# Patient Record
Sex: Male | Born: 1985 | Race: White | Hispanic: No | Marital: Single | Smoking: Former smoker
Health system: Southern US, Community
[De-identification: ages and names within clinical notes are randomized; demographics above are authoritative.]

## PROBLEM LIST (undated history)

## (undated) DIAGNOSIS — I10 Essential (primary) hypertension: Secondary | ICD-10-CM

## (undated) DIAGNOSIS — R569 Unspecified convulsions: Secondary | ICD-10-CM

## (undated) DIAGNOSIS — F32A Depression, unspecified: Secondary | ICD-10-CM

## (undated) HISTORY — PX: FINGER SURGERY: SHX640

## (undated) HISTORY — DX: Unspecified convulsions: R56.9

## (undated) HISTORY — DX: Essential (primary) hypertension: I10

## (undated) HISTORY — PX: SHOULDER SURGERY: SHX246

## (undated) HISTORY — PX: OTHER SURGICAL HISTORY: SHX169

## (undated) HISTORY — DX: Depression, unspecified: F32.A

## (undated) HISTORY — PX: IM NAILING HUMERUS: SUR733

---
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
## 2003-05-05 ENCOUNTER — Emergency Department (HOSPITAL_COMMUNITY): Admission: EM | Admit: 2003-05-05 | Discharge: 2003-05-05 | Payer: Self-pay | Admitting: Emergency Medicine

## 2003-09-03 ENCOUNTER — Emergency Department (HOSPITAL_COMMUNITY): Admission: EM | Admit: 2003-09-03 | Discharge: 2003-09-03 | Payer: Self-pay | Admitting: *Deleted

## 2003-12-24 ENCOUNTER — Emergency Department (HOSPITAL_COMMUNITY): Admission: AD | Admit: 2003-12-24 | Discharge: 2003-12-24 | Payer: Self-pay | Admitting: Family Medicine

## 2004-10-28 ENCOUNTER — Ambulatory Visit: Payer: Self-pay | Admitting: Psychiatry

## 2004-10-28 ENCOUNTER — Inpatient Hospital Stay (HOSPITAL_COMMUNITY): Admission: EM | Admit: 2004-10-28 | Discharge: 2004-10-31 | Payer: Self-pay | Admitting: Psychiatry

## 2005-04-21 ENCOUNTER — Emergency Department (HOSPITAL_COMMUNITY): Admission: EM | Admit: 2005-04-21 | Discharge: 2005-04-21 | Payer: Self-pay | Admitting: Emergency Medicine

## 2005-09-13 ENCOUNTER — Emergency Department (HOSPITAL_COMMUNITY): Admission: EM | Admit: 2005-09-13 | Discharge: 2005-09-13 | Payer: Self-pay | Admitting: Emergency Medicine

## 2005-11-25 ENCOUNTER — Emergency Department (HOSPITAL_COMMUNITY): Admission: EM | Admit: 2005-11-25 | Discharge: 2005-11-25 | Payer: Self-pay | Admitting: Emergency Medicine

## 2005-11-25 ENCOUNTER — Ambulatory Visit: Payer: Self-pay | Admitting: Orthopedic Surgery

## 2005-11-29 ENCOUNTER — Ambulatory Visit (HOSPITAL_BASED_OUTPATIENT_CLINIC_OR_DEPARTMENT_OTHER): Admission: RE | Admit: 2005-11-29 | Discharge: 2005-11-29 | Payer: Self-pay | Admitting: Orthopedic Surgery

## 2005-12-14 ENCOUNTER — Encounter (HOSPITAL_COMMUNITY): Admission: RE | Admit: 2005-12-14 | Discharge: 2006-01-13 | Payer: Self-pay | Admitting: Orthopedic Surgery
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
## 2006-12-02 ENCOUNTER — Emergency Department (HOSPITAL_COMMUNITY): Admission: EM | Admit: 2006-12-02 | Discharge: 2006-12-02 | Payer: Self-pay | Admitting: Emergency Medicine

## 2007-03-02 ENCOUNTER — Emergency Department (HOSPITAL_COMMUNITY): Admission: EM | Admit: 2007-03-02 | Discharge: 2007-03-02 | Payer: Self-pay | Admitting: Emergency Medicine

## 2008-03-18 ENCOUNTER — Emergency Department (HOSPITAL_COMMUNITY): Admission: EM | Admit: 2008-03-18 | Discharge: 2008-03-18 | Payer: Self-pay | Admitting: Emergency Medicine

## 2008-03-19 ENCOUNTER — Emergency Department (HOSPITAL_COMMUNITY): Admission: EM | Admit: 2008-03-19 | Discharge: 2008-03-20 | Payer: Self-pay | Admitting: Emergency Medicine

## 2008-04-17 ENCOUNTER — Emergency Department (HOSPITAL_COMMUNITY): Admission: EM | Admit: 2008-04-17 | Discharge: 2008-04-17 | Payer: Self-pay | Admitting: Emergency Medicine
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
## 2008-12-14 ENCOUNTER — Emergency Department (HOSPITAL_COMMUNITY): Admission: EM | Admit: 2008-12-14 | Discharge: 2008-12-14 | Payer: Self-pay | Admitting: Emergency Medicine

## 2009-04-09 ENCOUNTER — Encounter: Admission: RE | Admit: 2009-04-09 | Discharge: 2009-04-09 | Payer: Self-pay | Admitting: Cardiovascular Disease

## 2009-08-15 ENCOUNTER — Emergency Department (HOSPITAL_COMMUNITY): Admission: EM | Admit: 2009-08-15 | Discharge: 2009-08-15 | Payer: Self-pay | Admitting: Emergency Medicine

## 2009-08-19 ENCOUNTER — Ambulatory Visit (HOSPITAL_BASED_OUTPATIENT_CLINIC_OR_DEPARTMENT_OTHER): Admission: RE | Admit: 2009-08-19 | Discharge: 2009-08-19 | Payer: Self-pay | Admitting: Plastic Surgery

## 2009-09-16 ENCOUNTER — Encounter (HOSPITAL_COMMUNITY): Admission: RE | Admit: 2009-09-16 | Discharge: 2009-10-16 | Payer: Self-pay | Admitting: Plastic Surgery

## 2011-01-27 LAB — POCT HEMOGLOBIN-HEMACUE: Hemoglobin: 18.2 g/dL — ABNORMAL HIGH (ref 13.0–17.0)

## 2011-03-11 NOTE — Discharge Summary (Signed)
Edward Zamora, Edward Zamora                 ACCOUNT NO.:  [id number]   MEDICAL RECORD NO.:  [id number]          PATIENT TYPE:  IPS   LOCATION:  0305                          FACILITY:  [facility]   PHYSICIAN:  Jeanice Lim, M.D. DATE OF BIRTH:  1986/05/30   DATE OF ADMISSION:  10/28/2004  DATE OF DISCHARGE:  10/31/2004                                 DISCHARGE SUMMARY   IDENTIFYING INFORMATION:  This is a 25 year old white male who is single.  This is a voluntary admission.   HISTORY OF PRESENT ILLNESS:  This patient had taken approximately 16 tabs of  Strattera in an intentional suicide attempt after arguing with his  girlfriend.  His parents had referred him for admission, feeling that his  temper had gotten worse in the past two months, since he had been under  considerably increased stress.  His girlfriend had gotten pregnant and they  had a 51-month-old baby.  The patient was feeling under pressure to get  engaged to her and get married and they had been arguing about various  circumstances surrounding a potential marriage.  On initial presentation,  the patient endorsed feeling anxious, agitated, disappointed in himself,  endorses feeling worried about being a father and living up to his  responsibilities.  Had a history of being diagnosed with ADHD and history of  impulsive behaviors.  The patient, himself, felt a bit impulsive and not  well-controlled.  He was cooperative and pleasant at the time of admission.   PAST PSYCHIATRIC HISTORY:  This is the patient's first admission to [facility].  He did have a prior history of overdosing on  Xanax several years ago.  Had a history of being diagnosed with ADHD and had  some breaking and entering charges back in 2004 but none recently.   SOCIAL HISTORY:  The patient had finished high school, working odd jobs,  trying to scrape enough [employer] together to buy his girlfriend a ring.  He is  of the [facility] faith and  currently living with his mother and father.  Has a  34-month-old baby, who is currently living with the patient's finance.   FAMILY HISTORY:  Unremarkable for mental illness or depression.   ALCOHOL/DRUG HISTORY:  The patient denied any substance abuse.   PRIMARY CARE PHYSICIAN:  Dr. Littie Deeds.   MEDICAL HISTORY:  He denied having any active medical problems or taking any  medications on a routine basis.  He had previously been prescribed  Strattera, which he was not taking because he did not like the way it made  him feel.   ALLERGIES:  None.   POSITIVE PHYSICAL FINDINGS:  The patient's full physical examination had  been done in the emergency room by Dr. Margretta Ditty and was negative for any  significant findings.  At the time of admission, the patient was alert, well-  oriented, healthy-appearing, white male who was in no acute distress.  Vital  signs were within normal limits.   LABORATORY DATA:  Urine negative.  Metabolic panel was within normal limits.  His electrolytes were normal.  BUN 11, creatinine 1.1.  Liver enzymes were  all within normal limits.  TSH normal at 2.404.  He was also medical cleared  in the emergency room with a normal EKG.   INITIAL MENTAL STATUS EXAM:  The patient is fully alert, pleasant,  cooperative with an appropriate affect.  Calm and pleasant.  Affect is  constricted.  Speech is normal in pace and tone.  It is relevant, pertinent.  He tracks conversation well.  Mood is depressed.  He expresses that he feels  quite disappointed in himself but feels overwhelmed when his fiance starts  to yell at him.  He was disappointed because he picked out a ring for her  and then it turns out that she does not like it and she wants one that he  feels that he is unable to afford.  Thought process is positive for suicidal  ideation, just feeling hopeless that he will not be able to live up to the  responsibilities and the demands that everyone is putting on him.   No  homicidal ideation.  He is somewhat intellectually limited.  Cognitively  intact and oriented x 3.  Insight is limited.  Impulse control and judgment  within normal limits.  Intellect limited.   ADMISSION DIAGNOSES:   AXIS I:  1.  Mood disorder not otherwise specified.  2.  Rule out bipolar disorder, type 2.  3.  Attention-deficit hyperactivity disorder, by history.   AXIS II:  No diagnosis.   AXIS III:  Status post SNRI overdose.   AXIS IV:  Moderate (stress with family relationships; having a stable  relationship with parents is an asset to him).   AXIS V:  Current 25; past year 26.   HOSPITAL COURSE:  The patient was admitted and started on Lexapro 5 mg  daily.  He responded well to the medication, did well in groups, expressed  his concerns and was able to verbalize his concerns about the stress that he  was under.  He was tolerating the medication well and was able to verbalize  that he would continue taking his medication and would follow through with  his therapist after he left.  A family session was held on the 8th and the  patient's parents were able to describe some of his impulsive behaviors that  were bothering them and were able to verbalize their concerns.  Specifically, they were concerned that he was not telling the truth and was  stealing things.  The patient was able to verbalize and commit to handling  his anger better, not having outbursts, was willing to let his mother  monitor him taking his medication.  The patient felt better on the Lexapro  and was tolerating it well.  By the 8th, he was endorsing no suicidal  thoughts, feeling in control, feeling that he could cope with the pressures  and his parents were willing to take him back and he agreed to allow them to  monitor his medications.  The decision was made to discharge him.   MENTAL STATUS EXAM:  At discharge, the patient was fully alert, cooperative, pleasant.  Insight adequate.  He had been  interacting well in group.  No  suicidal or homicidal thoughts.  His mood was euthymic.  No signs of  psychosis.   FINAL DIAGNOSES:   AXIS I:  1.  Adjustment disorder with underlying depression.  2.  Attention-deficit hyperactivity disorder, by history.   AXIS II:  No diagnosis.   AXIS III:  Status post SNRI overdose.   AXIS IV:  Moderate (stress related to stage of life).   AXIS V:  Current 55; past year 60-65.   DISCHARGE MEDICATIONS:  Lexapro 5 mg p.o. q.a.m.   FOLLOW UP:  The patient was discharged with plans to follow up [place] with mental health and the casemanagers were scheduled to call him  with an appointment.      MAS/MEDQ  D:  11/29/2004  T:  11/29/2004  Job:  [id number]
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
## 2011-03-11 NOTE — Procedures (Signed)
   NAMECONSTANCE, Edward Zamora                             ACCOUNT NO.:  [id number]   MEDICAL RECORD NO.:  [id number]                   PATIENT TYPE:  EMS   LOCATION:  ED                                   FACILITY:  [facility]   PHYSICIAN:  Edward L. Juanetta Gosling, M.D.             DATE OF BIRTH:  1986/02/17   DATE OF PROCEDURE:  08/08/2002  DATE OF DISCHARGE:  08/08/2002                                EKG INTERPRETATION   TIME AND DATE:  6045 on 08/08/02   INTERPRETATION:  The rhythm is sinus rhythm with a rate in the 70s.  Small Q  waves are seen inferiorly.  These may be of no significance but clinical  correlation is suggested.  There is a suggestion of early repolarization.   IMPRESSION:  Borderline electrocardiogram.                                               [name], M.D.    ELH/MEDQ  D:  08/08/2002  T:  08/09/2002  Job:  [id number]
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
## 2013-04-11 ENCOUNTER — Emergency Department (INDEPENDENT_AMBULATORY_CARE_PROVIDER_SITE_OTHER): Payer: Medicare Other

## 2013-04-11 ENCOUNTER — Emergency Department (INDEPENDENT_AMBULATORY_CARE_PROVIDER_SITE_OTHER)
Admission: EM | Admit: 2013-04-11 | Discharge: 2013-04-11 | Disposition: A | Discharge status: Home / Self Care | Payer: Medicare Other | Source: Home / Self Care | Attending: Family Medicine | Admitting: Family Medicine

## 2013-04-11 ENCOUNTER — Encounter (HOSPITAL_COMMUNITY): Payer: Self-pay | Admitting: Emergency Medicine

## 2013-04-11 DIAGNOSIS — S43429A Sprain of unspecified rotator cuff capsule, initial encounter: Secondary | ICD-10-CM

## 2013-04-11 DIAGNOSIS — S46012A Strain of muscle(s) and tendon(s) of the rotator cuff of left shoulder, initial encounter: Secondary | ICD-10-CM

## 2013-04-11 DIAGNOSIS — S335XXA Sprain of ligaments of lumbar spine, initial encounter: Secondary | ICD-10-CM

## 2013-04-11 DIAGNOSIS — S39012A Strain of muscle, fascia and tendon of lower back, initial encounter: Secondary | ICD-10-CM

## 2013-04-11 MED ORDER — IBUPROFEN 600 MG PO TABS: 600.0000 mg | ORAL_TABLET | Freq: Three times a day (TID) | ORAL | Status: DC | PRN

## 2013-04-11 MED ORDER — TRAMADOL HCL 50 MG PO TABS: 50.0000 mg | ORAL_TABLET | Freq: Four times a day (QID) | ORAL | Status: DC | PRN

## 2013-04-11 MED ORDER — CYCLOBENZAPRINE HCL 10 MG PO TABS: 10.0000 mg | ORAL_TABLET | Freq: Two times a day (BID) | ORAL | Status: DC | PRN
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
## 2015-02-27 ENCOUNTER — Emergency Department (HOSPITAL_COMMUNITY)
Admission: EM | Admit: 2015-02-27 | Discharge: 2015-02-27 | Disposition: A | Discharge status: Home / Self Care | Payer: Medicare Other | Attending: Emergency Medicine | Admitting: Emergency Medicine

## 2015-02-27 ENCOUNTER — Emergency Department (HOSPITAL_COMMUNITY): Payer: Medicare Other

## 2015-02-27 ENCOUNTER — Encounter (HOSPITAL_COMMUNITY): Payer: Self-pay | Admitting: *Deleted

## 2015-02-27 DIAGNOSIS — Y998 Other external cause status: Secondary | ICD-10-CM | POA: Insufficient documentation

## 2015-02-27 DIAGNOSIS — W108XXA Fall (on) (from) other stairs and steps, initial encounter: Secondary | ICD-10-CM | POA: Insufficient documentation

## 2015-02-27 DIAGNOSIS — Y9289 Other specified places as the place of occurrence of the external cause: Secondary | ICD-10-CM | POA: Insufficient documentation

## 2015-02-27 DIAGNOSIS — S300XXA Contusion of lower back and pelvis, initial encounter: Secondary | ICD-10-CM

## 2015-02-27 DIAGNOSIS — S199XXA Unspecified injury of neck, initial encounter: Secondary | ICD-10-CM | POA: Diagnosis present

## 2015-02-27 DIAGNOSIS — Y9389 Activity, other specified: Secondary | ICD-10-CM | POA: Diagnosis not present

## 2015-02-27 DIAGNOSIS — S134XXA Sprain of ligaments of cervical spine, initial encounter: Secondary | ICD-10-CM | POA: Diagnosis not present

## 2015-02-27 MED ORDER — CYCLOBENZAPRINE HCL 10 MG PO TABS: 10.0000 mg | ORAL_TABLET | Freq: Two times a day (BID) | ORAL | Status: DC | PRN

## 2015-02-27 MED ORDER — HYDROCODONE-ACETAMINOPHEN 5-325 MG PO TABS: 1.0000 | ORAL_TABLET | ORAL | Status: DC | PRN

## 2015-02-27 MED ORDER — NAPROXEN 500 MG PO TABS: 500.0000 mg | ORAL_TABLET | Freq: Two times a day (BID) | ORAL | Status: DC

## 2015-02-27 MED ORDER — OXYCODONE-ACETAMINOPHEN 5-325 MG PO TABS
1.0000 | ORAL_TABLET | Freq: Once | ORAL | Status: AC
  Administered 2015-02-27: 1 via ORAL
  Filled 2015-02-27: qty 1

## 2015-02-27 NOTE — ED Provider Notes (Signed)
CSN: [id number]     Arrival date & time 02/27/15  1413 History   First MD Initiated Contact with Patient 02/27/15 1451     Chief Complaint  Patient presents with  . Fall     (Consider location/radiation/quality/duration/timing/severity/associated sxs/prior Treatment) Patient is a 29 y.o. male presenting with fall.  Fall This is a new problem. The current episode started yesterday. The problem has been gradually worsening. Associated symptoms include neck pain. The symptoms are aggravated by bending and walking. He has tried nothing for the symptoms.   Lesly DukesCasey W Cassidy is a 29 y.o. male who presents to the ED with neck and back pain s/p fall yesterday. He states he was going up steps onto a porch that was not stable and he fell approximately 8 feet. He landed on his back. Today he has increased pain to the back of his neck and his lower back. He denies n/v, los of control of bladder or bowels, head injury or LOC.   History reviewed. No pertinent past medical history. Past Surgical History  Procedure Laterality Date  . Shoulder surgery    . Im nailing humerus    . Finger    . Finger surgery     History reviewed. No pertinent family history. History  Substance Use Topics  . Smoking status: Never Smoker   . Smokeless tobacco: Current User    Types: Chew  . Alcohol Use: No    Review of Systems  Musculoskeletal: Positive for back pain and neck pain.  all other systems negative    Allergies  Review of patient's allergies indicates no known allergies.  Home Medications   Prior to Admission medications   Medication Sig Start Date End Date Taking? Authorizing Provider  cyclobenzaprine (FLEXERIL) 10 MG tablet Take 1 tablet (10 mg total) by mouth 2 (two) times daily as needed for muscle spasms. 02/27/15   Shaul Trautman Orlene OchM Davanee Klinkner, NP  HYDROcodone-acetaminophen (NORCO/VICODIN) 5-325 MG per tablet Take 1 tablet by mouth every 4 (four) hours as needed. 02/27/15   Chip Canepa Orlene OchM Lalita Ebel, NP  naproxen (NAPROSYN)  500 MG tablet Take 1 tablet (500 mg total) by mouth 2 (two) times daily. 02/27/15   Charlyn Vialpando Orlene OchM [place] Ke, NP   BP 154/95 mmHg  Pulse 59  Temp(Src) 98.2 F (36.8 C) (Oral)  Resp 14  Ht 5\' 7"  (1.702 m)  Wt 200 lb (90.719 kg)  BMI 31.32 kg/m2  SpO2 96% Physical Exam  Constitutional: He is oriented to person, place, and time. He appears well-developed and well-nourished. No distress.  HENT:  Head: Normocephalic and atraumatic.  Eyes: Conjunctivae and EOM are normal. Pupils are equal, round, and reactive to light.  Neck: Normal range of motion. Neck supple. Spinous process tenderness and muscular tenderness present.  Cardiovascular: Normal rate and regular rhythm.   Pulmonary/Chest: Effort normal. No respiratory distress. He has no wheezes. He has no rales.  Abdominal: Soft. Bowel sounds are normal. There is no tenderness.  Musculoskeletal: Normal range of motion. He exhibits no edema.       Cervical back: He exhibits tenderness.       Lumbar back: He exhibits tenderness and pain. He exhibits no deformity, no laceration, no spasm and normal pulse. Decreased range of motion: due to pain.       Back:  Radial pulses 2+ bilateral, grips equal, adequate circulation.   Neurological: He is alert and oriented to person, place, and time. He has normal strength. No cranial nerve deficit or sensory deficit. Coordination and  gait normal.  Reflex Scores:      Bicep reflexes are 2+ on the right side and 2+ on the left side.      Brachioradialis reflexes are 2+ on the right side and 2+ on the left side.      Patellar reflexes are 2+ on the right side and 2+ on the left side.      Achilles reflexes are 2+ on the right side and 2+ on the left side. Skin: Skin is warm and dry.  Psychiatric: He has a normal mood and affect. His behavior is normal.  Nursing note and vitals reviewed.   ED Course  Procedures (including critical care time) Labs Review Labs Reviewed - No data to display  Imaging Review Dg  Cervical Spine Complete  02/27/2015   CLINICAL DATA:  Larey SeatFell last pm when going up steps , app 8 steps, Says the steps broke under him. Pain Lt lower leg and low back. patient states "hurts a little bit".  EXAM: CERVICAL SPINE  4+ VIEWS  COMPARISON:  Cervical CT, 11/25/2005  FINDINGS: No fracture.  No spondylolisthesis.  Endplate spurring and mild sclerosis noted at C5-C6 with minor loss of disc height. This has developed since the prior CT. Remaining disc spaces are well preserved.  Uncovertebral spurring leads to mild neural foraminal narrowing bilaterally at C5-C6. Remaining neural foramina are well preserved.  Normal soft tissues.  IMPRESSION: No fracture or acute finding.   Electronically Signed   By: Amie Portlandavid  Ormond M.D.   On: 02/27/2015 16:03   Dg Lumbar Spine Complete  02/27/2015   CLINICAL DATA:  29 year old male with a history of lumbar back pain  EXAM: LUMBAR SPINE - COMPLETE 4+ VIEW  COMPARISON:  None.  FINDINGS: Lumbar Spine:  Lumbar vertebral elements maintain normal alignment without evidence of anterolisthesis, retrolisthesis, subluxation.  No fracture line identified. Vertebral body heights maintained as well as disc space heights.  No significant degenerative disc disease or endplate changes. No significant facet changes.  Unremarkable appearance of the visualized abdomen.  Defect at the anterior superior endplate of L4 is chronic.  Oblique images demonstrate no evidence of displaced pars defect.  IMPRESSION: No radiographic evidence of acute fracture or malalignment of the lumbar spine.  Signed,  Yvone NeuJaime S. Loreta AveWagner, DO  [facility]  [facility]   Electronically Signed   By: Gilmer MorJaime  Wagner D.O.   On: 02/27/2015 16:03    MDM  29 y.o. male with neck and back pain s/p fall yesterday. Stable for d/c without fractures on x-ray and without neuro deficits. Discussed with the patient clinical and x-ray findings and plan of care and all questioned fully answered.  He will return if any problems arise.  Final diagnoses:  Sprain of ligaments of cervical spine, initial encounter  Contusion of lower back, initial encounter        [facility] Fontaine Hehl, NP 02/27/15 1636  Gilda Creasehristopher J Pollina, MD 02/28/15 [phone]

## 2015-02-27 NOTE — ED Notes (Signed)
Pt made aware to return if symptoms worsen or if any life threatening symptoms occur.   

## 2015-02-27 NOTE — Discharge Instructions (Signed)
Your x-rays today show no fractures. We are treating your pain. Do not take the muscle relaxant or narcotic if driving because they will make you sleepy.   Cervical Sprain A cervical sprain is an injury in the neck in which the strong, fibrous tissues (ligaments) that connect your neck bones stretch or tear. Cervical sprains can range from mild to severe. Severe cervical sprains can cause the neck vertebrae to be unstable. This can lead to damage of the spinal cord and can result in serious nervous system problems. The amount of time it takes for a cervical sprain to get better depends on the cause and extent of the injury. Most cervical sprains heal in 1 to 3 weeks. CAUSES  Severe cervical sprains may be caused by:   Contact sport injuries (such as from football, rugby, wrestling, hockey, auto racing, gymnastics, diving, martial arts, or boxing).   Motor vehicle collisions.   Whiplash injuries. This is an injury from a sudden forward and backward whipping movement of the head and neck.  Falls.  Mild cervical sprains may be caused by:   Being in an awkward position, such as while cradling a telephone between your ear and shoulder.   Sitting in a chair that does not offer proper support.   Working at a poorly Marketing executivedesigned computer station.   Looking up or down for long periods of time.  SYMPTOMS   Pain, soreness, stiffness, or a burning sensation in the front, back, or sides of the neck. This discomfort may develop immediately after the injury or slowly, 24 hours or more after the injury.   Pain or tenderness directly in the middle of the back of the neck.   Shoulder or upper back pain.   Limited ability to move the neck.   Headache.   Dizziness.   Weakness, numbness, or tingling in the hands or arms.   Muscle spasms.   Difficulty swallowing or chewing.   Tenderness and swelling of the neck.  DIAGNOSIS  Most of the time your health care provider can diagnose  a cervical sprain by taking your history and doing a physical exam. Your health care provider will ask about previous neck injuries and any known neck problems, such as arthritis in the neck. X-rays may be taken to find out if there are any other problems, such as with the bones of the neck. Other tests, such as a CT scan or MRI, may also be needed.  TREATMENT  Treatment depends on the severity of the cervical sprain. Mild sprains can be treated with rest, keeping the neck in place (immobilization), and pain medicines. Severe cervical sprains are immediately immobilized. Further treatment is done to help with pain, muscle spasms, and other symptoms and may include:  Medicines, such as pain relievers, numbing medicines, or muscle relaxants.   Physical therapy. This may involve stretching exercises, strengthening exercises, and posture training. Exercises and improved posture can help stabilize the neck, strengthen muscles, and help stop symptoms from returning.  HOME CARE INSTRUCTIONS   Put ice on the injured area.   Put ice in a plastic bag.   Place a towel between your skin and the bag.   Leave the ice on for 15-20 minutes, 3-4 times a day.   If your injury was severe, you may have been given a cervical collar to wear. A cervical collar is a two-piece collar designed to keep your neck from moving while it heals.  Do not remove the collar unless instructed by your  health care provider.  If you have long hair, keep it outside of the collar.  Ask your health care provider before making any adjustments to your collar. Minor adjustments may be required over time to improve comfort and reduce pressure on your chin or on the back of your head.  Ifyou are allowed to remove the collar for cleaning or bathing, follow your health care provider's instructions on how to do so safely.  Keep your collar clean by wiping it with mild soap and water and drying it completely. If the collar you have  been given includes removable pads, remove them every 1-2 days and hand wash them with soap and water. Allow them to air dry. They should be completely dry before you wear them in the collar.  If you are allowed to remove the collar for cleaning and bathing, wash and dry the skin of your neck. Check your skin for irritation or sores. If you see any, tell your health care provider.  Do not drive while wearing the collar.   Only take over-the-counter or prescription medicines for pain, discomfort, or fever as directed by your health care provider.   Keep all follow-up appointments as directed by your health care provider.   Keep all physical therapy appointments as directed by your health care provider.   Make any needed adjustments to your workstation to promote good posture.   Avoid positions and activities that make your symptoms worse.   Warm up and stretch before being active to help prevent problems.  SEEK MEDICAL CARE IF:   Your pain is not controlled with medicine.   You are unable to decrease your pain medicine over time as planned.   Your activity level is not improving as expected.  SEEK IMMEDIATE MEDICAL CARE IF:   You develop any bleeding.  You develop stomach upset.  You have signs of an allergic reaction to your medicine.   Your symptoms get worse.   You develop new, unexplained symptoms.   You have numbness, tingling, weakness, or paralysis in any part of your body.  MAKE SURE YOU:   Understand these instructions.  Will watch your condition.  Will get help right away if you are not doing well or get worse. Document Released: 08/07/2007 Document Revised: 10/15/2013 Document Reviewed: 04/17/2013 [facility] Patient Information 2015 [place]. This information is not intended to replace advice given to you by your health care provider. Make sure you discuss any questions you have with your health care provider.

## 2015-02-27 NOTE — ED Notes (Signed)
Fell last pm when going up steps , app 8 steps,  Says the steps broke under him.  Pain  Lt lower leg and low back.  No HI, neck "hurts a little bit".

## 2015-06-29 ENCOUNTER — Emergency Department (HOSPITAL_COMMUNITY): Payer: Medicare Other

## 2015-06-29 ENCOUNTER — Emergency Department (HOSPITAL_COMMUNITY)
Admission: EM | Admit: 2015-06-29 | Discharge: 2015-06-29 | Disposition: A | Discharge status: Home / Self Care | Payer: Medicare Other | Attending: Emergency Medicine | Admitting: Emergency Medicine

## 2015-06-29 ENCOUNTER — Encounter (HOSPITAL_COMMUNITY): Payer: Self-pay | Admitting: Emergency Medicine

## 2015-06-29 DIAGNOSIS — Y9289 Other specified places as the place of occurrence of the external cause: Secondary | ICD-10-CM | POA: Diagnosis not present

## 2015-06-29 DIAGNOSIS — W2209XA Striking against other stationary object, initial encounter: Secondary | ICD-10-CM | POA: Diagnosis not present

## 2015-06-29 DIAGNOSIS — S60221A Contusion of right hand, initial encounter: Secondary | ICD-10-CM | POA: Insufficient documentation

## 2015-06-29 DIAGNOSIS — Y998 Other external cause status: Secondary | ICD-10-CM | POA: Diagnosis not present

## 2015-06-29 DIAGNOSIS — Y9389 Activity, other specified: Secondary | ICD-10-CM | POA: Insufficient documentation

## 2015-06-29 DIAGNOSIS — Z791 Long term (current) use of non-steroidal anti-inflammatories (NSAID): Secondary | ICD-10-CM | POA: Insufficient documentation

## 2015-06-29 DIAGNOSIS — S6991XA Unspecified injury of right wrist, hand and finger(s), initial encounter: Secondary | ICD-10-CM | POA: Diagnosis present

## 2015-06-29 MED ORDER — IBUPROFEN 600 MG PO TABS: 600.0000 mg | ORAL_TABLET | Freq: Four times a day (QID) | ORAL | Status: DC | PRN

## 2015-06-29 NOTE — ED Notes (Signed)
Injury to right hand after hitting a wall with is hand.  Rates pain 8/10.

## 2015-06-29 NOTE — Discharge Instructions (Signed)
Contusion °A contusion is a deep bruise. Contusions are the result of an injury that caused bleeding under the skin. The contusion may turn blue, purple, or yellow. Minor injuries will give you a painless contusion, but more severe contusions may stay painful and swollen for a few weeks.  °CAUSES  °A contusion is usually caused by a blow, trauma, or direct force to an area of the body. °SYMPTOMS  °· Swelling and redness of the injured area. °· Bruising of the injured area. °· Tenderness and soreness of the injured area. °· Pain. °DIAGNOSIS  °The diagnosis can be made by taking a history and physical exam. An X-ray, CT scan, or MRI may be needed to determine if there were any associated injuries, such as fractures. °TREATMENT  °Specific treatment will depend on what area of the body was injured. In general, the best treatment for a contusion is resting, icing, elevating, and applying cold compresses to the injured area. Over-the-counter medicines may also be recommended for pain control. Ask your caregiver what the best treatment is for your contusion. °HOME CARE INSTRUCTIONS  °· Put ice on the injured area. °¨ Put ice in a plastic bag. °¨ Place a towel between your skin and the bag. °¨ Leave the ice on for 15-20 minutes, 3-4 times a day, or as directed by your health care provider. °· Only take over-the-counter or prescription medicines for pain, discomfort, or fever as directed by your caregiver. Your caregiver may recommend avoiding anti-inflammatory medicines (aspirin, ibuprofen, and naproxen) for 48 hours because these medicines may increase bruising. °· Rest the injured area. °· If possible, elevate the injured area to reduce swelling. °SEEK IMMEDIATE MEDICAL CARE IF:  °· You have increased bruising or swelling. °· You have pain that is getting worse. °· Your swelling or pain is not relieved with medicines. °MAKE SURE YOU:  °· Understand these instructions. °· Will watch your condition. °· Will get help right  away if you are not doing well or get worse. °Document Released: 07/20/2005 Document Revised: 10/15/2013 Document Reviewed: 08/15/2011 °ExitCare® Patient Information ©2015 [employer], LLC. This information is not intended to replace advice given to you by your health care provider. Make sure you discuss any questions you have with your health care provider. ° °

## 2015-06-29 NOTE — ED Provider Notes (Signed)
CSN: [id number]     Arrival date & time 06/29/15  1325 History  This chart was scribed for non-physician practitioner, Burgess Amor, PA-C working with Samuel Jester, DO by Gwenyth Ober, ED scribe. This patient was seen in room APFT22/APFT22 and the patient's care was started at 4:23 PM   Chief Complaint  Patient presents with  . Hand Injury   The history is provided by the patient. No language interpreter was used.    HPI Comments: Edward Zamora is a 29 y.o. male who presents to the Emergency Department complaining of constant, moderate right hand pain that started 4.5 hours ago. He states numbness of his right 3rd, 4th and 5th fingers as associated symptoms. Pt applied ice and tried Aspirin with no relief. Pt reports onset of pain started after he punched a wall. He is right-hand dominant. Pt works in [employer]. He denies weakness in his wrist or fingers.  History reviewed. No pertinent past medical history. Past Surgical History  Procedure Laterality Date  . Shoulder surgery    . Im nailing humerus    . Finger    . Finger surgery     History reviewed. No pertinent family history. Social History  Substance Use Topics  . Smoking status: Never Smoker   . Smokeless tobacco: Current User    Types: Chew  . Alcohol Use: No    Review of Systems  Constitutional: Negative for fever.  Musculoskeletal: Positive for arthralgias. Negative for myalgias and joint swelling.  Neurological: Positive for numbness. Negative for weakness.  All other systems reviewed and are negative.  Allergies  Review of patient's allergies indicates no known allergies.  Home Medications   Prior to Admission medications   Medication Sig Start Date End Date Taking? Authorizing Provider  cyclobenzaprine (FLEXERIL) 10 MG tablet Take 1 tablet (10 mg total) by mouth 2 (two) times daily as needed for muscle spasms. 02/27/15   Hope Orlene Och, NP  HYDROcodone-acetaminophen (NORCO/VICODIN) 5-325 MG per tablet Take 1  tablet by mouth every 4 (four) hours as needed. 02/27/15   Hope Orlene Och, NP  ibuprofen (ADVIL,MOTRIN) 600 MG tablet Take 1 tablet (600 mg total) by mouth every 6 (six) hours as needed. 06/29/15   Burgess Amor, PA-C  naproxen (NAPROSYN) 500 MG tablet Take 1 tablet (500 mg total) by mouth 2 (two) times daily. 02/27/15   Hope Orlene Och, NP   BP 121/84 mmHg  Pulse 63  Temp(Src) 98.3 F (36.8 C) (Oral)  Resp 16  Ht  (1.651 m)  Wt 156 lb 4.8 oz (70.897 kg)  BMI 26.01 kg/m2  SpO2 100% Physical Exam  Constitutional: He appears well-developed and well-nourished.  HENT:  Head: Atraumatic.  Neck: Normal range of motion.  Cardiovascular:  Pulses equal bilaterally  Musculoskeletal: He exhibits tenderness. He exhibits no edema.  Full strength of his finger.  Decreased sensation to fine touch in his 5th, ring finger and 3rd finger.  Tender at his 4th and 5th proximal metacarpals.  Full range of wrist extension.flexion.  Normal resisted flex/ext of fingers, but with increased pain. Less than 2 s cap refill in his finger tips. Skin is intact. No edema. No deformity.  Neurological: He is alert. He has normal strength. He displays normal reflexes. No sensory deficit.  Skin: Skin is warm and dry.  Psychiatric: He has a normal mood and affect.  Nursing note and vitals reviewed.   ED Course  Procedures   DIAGNOSTIC STUDIES: Oxygen Saturation is 99% on RA,  normal by my interpretation.    COORDINATION OF CARE: 4:26 PM Discussed treatment plan with pt which includes a supportive splint, anti-inflammatories and rest. Will refer to an orthopedist for follow-up if no improvement. Pt agreed to plan.   Labs Review Labs Reviewed - No data to display  Imaging Review Dg Hand Complete Right  06/29/2015   CLINICAL DATA:  Hit a brick wall with right fist after argument. Pain in the back of the hand at the metacarpophalangeal joints 3 through 5. Pain up to the wrist.  EXAM: RIGHT HAND - COMPLETE 3+ VIEW   COMPARISON:  04/17/2008  FINDINGS: There is no evidence of fracture or dislocation. There is no evidence of arthropathy or other focal bone abnormality. Soft tissues are unremarkable.  IMPRESSION: Negative.   Electronically Signed   By: Norva Pavlov M.D.   On: 06/29/2015 14:11    EKG Interpretation None      MDM   Final diagnoses:  Hand contusion, right, initial encounter    Pt advised RICE, splint given.  Referral to ortho for f/u care if numbness persists despite RICE.  Pt displays FROM of his finger, no motor deficits. Ibuprofen prescribed.  Ortho referral given.  I personally performed the services described in this documentation, which was scribed in my presence. The recorded information has been reviewed and is accurate.   Burgess Amor, PA-C 07/01/15 1220  Samuel Jester, DO 07/02/15 2220

## 2015-07-01 ENCOUNTER — Telehealth: Payer: Self-pay | Admitting: Orthopedic Surgery

## 2015-07-01 NOTE — Telephone Encounter (Signed)
Call from patient, inquiring about appointment following Emergency room visit at [facility] for hand pain.  Relayed that primary care referral will be needed per (secondary)insurer's requirement.  States he will contact primary care, Dr Sudie Bailey, to request referral.  Appointment pending.

## 2015-07-14 NOTE — Telephone Encounter (Signed)
No referral or further response from patient received.

## 2015-08-08 ENCOUNTER — Encounter (HOSPITAL_COMMUNITY): Payer: Self-pay | Admitting: Emergency Medicine

## 2015-08-08 ENCOUNTER — Emergency Department (HOSPITAL_COMMUNITY)
Admission: EM | Admit: 2015-08-08 | Discharge: 2015-08-08 | Disposition: A | Discharge status: Home / Self Care | Payer: Medicare Other | Attending: Emergency Medicine | Admitting: Emergency Medicine

## 2015-08-08 ENCOUNTER — Emergency Department (HOSPITAL_COMMUNITY): Payer: Medicare Other

## 2015-08-08 DIAGNOSIS — S4991XA Unspecified injury of right shoulder and upper arm, initial encounter: Secondary | ICD-10-CM | POA: Diagnosis present

## 2015-08-08 DIAGNOSIS — W01198A Fall on same level from slipping, tripping and stumbling with subsequent striking against other object, initial encounter: Secondary | ICD-10-CM | POA: Diagnosis not present

## 2015-08-08 DIAGNOSIS — S46911A Strain of unspecified muscle, fascia and tendon at shoulder and upper arm level, right arm, initial encounter: Secondary | ICD-10-CM

## 2015-08-08 DIAGNOSIS — Y92007 Garden or yard of unspecified non-institutional (private) residence as the place of occurrence of the external cause: Secondary | ICD-10-CM | POA: Diagnosis not present

## 2015-08-08 DIAGNOSIS — Y998 Other external cause status: Secondary | ICD-10-CM | POA: Insufficient documentation

## 2015-08-08 DIAGNOSIS — S46811A Strain of other muscles, fascia and tendons at shoulder and upper arm level, right arm, initial encounter: Secondary | ICD-10-CM | POA: Insufficient documentation

## 2015-08-08 DIAGNOSIS — Y9389 Activity, other specified: Secondary | ICD-10-CM | POA: Diagnosis not present

## 2015-08-08 DIAGNOSIS — W19XXXA Unspecified fall, initial encounter: Secondary | ICD-10-CM

## 2015-08-08 MED ORDER — MELOXICAM 7.5 MG PO TABS: 7.5000 mg | ORAL_TABLET | Freq: Every day | ORAL | Status: DC

## 2015-08-08 NOTE — ED Notes (Signed)
Pt reports helping grandparents in yard 3 days ago and falling into tree.  Pt c/o left shoulder pain.

## 2015-08-10 NOTE — ED Provider Notes (Signed)
CSN: [id number]     Arrival date & time 08/08/15  1355 History   First MD Initiated Contact with Patient 08/08/15 1413     Chief Complaint  Patient presents with  . Shoulder Injury     (Consider location/radiation/quality/duration/timing/severity/associated sxs/prior Treatment) Patient is a 29 y.o. male presenting with shoulder injury. The history is provided by the patient.  Shoulder Injury This is a new problem. Episode onset: 3 days ago. The problem occurs constantly. The problem has been unchanged. Associated symptoms include arthralgias. Pertinent negatives include no fever, headaches, joint swelling, nausea, neck pain, numbness or weakness. Exacerbated by: attempts to raise arm over shoulder height. He has tried nothing for the symptoms.   Pt tripped and fell against a tree trunk while helping his grandparents remove a tree from their yard 3 days ago.  He landed directly against the trunk with his right shoulder and denies head or neck pain or injury.  History reviewed. No pertinent past medical history. Past Surgical History  Procedure Laterality Date  . Shoulder surgery    . Im nailing humerus    . Finger    . Finger surgery     History reviewed. No pertinent family history. Social History  Substance Use Topics  . Smoking status: Never Smoker   . Smokeless tobacco: Current User    Types: Chew  . Alcohol Use: No    Review of Systems  Constitutional: Negative for fever.  Gastrointestinal: Negative for nausea.  Musculoskeletal: Positive for arthralgias. Negative for joint swelling and neck pain.  Neurological: Negative for weakness, numbness and headaches.      Allergies  Review of patient's allergies indicates no known allergies.  Home Medications   Prior to Admission medications   Medication Sig Start Date End Date Taking? Authorizing Provider  cyclobenzaprine (FLEXERIL) 10 MG tablet Take 1 tablet (10 mg total) by mouth 2 (two) times daily as needed for muscle  spasms. 02/27/15   Hope Orlene OchM Neese, NP  HYDROcodone-acetaminophen (NORCO/VICODIN) 5-325 MG per tablet Take 1 tablet by mouth every 4 (four) hours as needed. 02/27/15   Hope Orlene OchM Neese, NP  meloxicam (MOBIC) 7.5 MG tablet Take 1-2 tablets (7.5-15 mg total) by mouth daily. 08/08/15   Burgess AmorJulie Gradie Butrick, PA-C   BP 121/85 mmHg  Pulse 99  Temp(Src) 98.1 F (36.7 C) (Oral)  Resp 16  Ht 5\' 7"  (1.702 m)  Wt 200 lb (90.719 kg)  BMI 31.32 kg/m2  SpO2 99% Physical Exam  Constitutional: He appears well-developed and well-nourished.  HENT:  Head: Atraumatic.  Neck: Normal range of motion and full passive range of motion without pain. Neck supple.  Cardiovascular:  Pulses:      Radial pulses are 2+ on the right side, and 2+ on the left side.  Pulses equal bilaterally  Musculoskeletal: He exhibits tenderness.       Right shoulder: He exhibits decreased range of motion, bony tenderness and pain. He exhibits no swelling, no effusion, no crepitus, no spasm, normal pulse and normal strength.  TTP anterior and lateral right shoulder joint space. There is no visible trauma (no edema, contusion).  Pain is worsened with attempts at abduction and flexion beyond 90 degrees both active and passive.  No crepitus.  Clavicle nontender without deformity.  TTP across upper trapezius, no spasm.  Neurological: He is alert. He has normal strength. He displays normal reflexes. No sensory deficit.  Reflex Scores:      Bicep reflexes are 2+ on the right side and 2+  on the left side. Equal grip strength.  Skin: Skin is warm and dry.  Psychiatric: He has a normal mood and affect.    ED Course  Procedures (including critical care time) Labs Review Labs Reviewed - No data to display  Imaging Review Dg Shoulder Right  08/08/2015  CLINICAL DATA:  29 year old male with history of trauma from a fall off of a ladder onto the right shoulder 3 days ago complaining of right shoulder pain. EXAM: RIGHT SHOULDER - 2+ VIEW COMPARISON:  No  priors. FINDINGS: Multiple views of the right shoulder demonstrate no acute displaced fracture, subluxation, dislocation, or soft tissue abnormality. IMPRESSION: No acute radiographic abnormality of the right shoulder. Electronically Signed   By: Trudie Reed M.D.   On: 08/08/2015 14:33   I have personally reviewed and evaluated these images and lab results as part of my medical decision-making.   EKG Interpretation None      MDM   Final diagnoses:  Shoulder strain, right, initial encounter      Radiological studies were viewed, interpreted and considered during the medical decision making and disposition process. I agree with radiologists reading.  Results were also discussed with patient.   Pt with direct blow to right shoulder with normal imaging today.  Suspect shoulder strain/contusion, but also discussed with pt possibility of rotator/soft tissue injury.  Placed in sling, meloxicam prescribed, advised ice tx,  Adding heat tx after 3 days of ice tx.  F/u with his pcp for further care if sx persist beyond the next 7-10 days with this tx.  Pt understands and agrees with plan.   Burgess Amor, PA-C 08/10/15 1610  Benjiman Core, MD 08/14/15 [phone]

## 2015-09-13 ENCOUNTER — Emergency Department (HOSPITAL_COMMUNITY)
Admission: EM | Admit: 2015-09-13 | Discharge: 2015-09-13 | Disposition: A | Discharge status: Home / Self Care | Payer: Medicare Other | Attending: Emergency Medicine | Admitting: Emergency Medicine

## 2015-09-13 ENCOUNTER — Encounter (HOSPITAL_COMMUNITY): Payer: Self-pay | Admitting: *Deleted

## 2015-09-13 ENCOUNTER — Emergency Department (HOSPITAL_COMMUNITY): Payer: Medicare Other

## 2015-09-13 DIAGNOSIS — Y9289 Other specified places as the place of occurrence of the external cause: Secondary | ICD-10-CM | POA: Insufficient documentation

## 2015-09-13 DIAGNOSIS — S4991XA Unspecified injury of right shoulder and upper arm, initial encounter: Secondary | ICD-10-CM | POA: Diagnosis not present

## 2015-09-13 DIAGNOSIS — Z9889 Other specified postprocedural states: Secondary | ICD-10-CM | POA: Insufficient documentation

## 2015-09-13 DIAGNOSIS — M25512 Pain in left shoulder: Secondary | ICD-10-CM

## 2015-09-13 DIAGNOSIS — S4992XA Unspecified injury of left shoulder and upper arm, initial encounter: Secondary | ICD-10-CM | POA: Insufficient documentation

## 2015-09-13 DIAGNOSIS — W132XXA Fall from, out of or through roof, initial encounter: Secondary | ICD-10-CM | POA: Insufficient documentation

## 2015-09-13 DIAGNOSIS — Y9389 Activity, other specified: Secondary | ICD-10-CM | POA: Diagnosis not present

## 2015-09-13 DIAGNOSIS — S3992XA Unspecified injury of lower back, initial encounter: Secondary | ICD-10-CM | POA: Diagnosis not present

## 2015-09-13 DIAGNOSIS — M25511 Pain in right shoulder: Secondary | ICD-10-CM

## 2015-09-13 DIAGNOSIS — Y998 Other external cause status: Secondary | ICD-10-CM | POA: Insufficient documentation

## 2015-09-13 DIAGNOSIS — W19XXXA Unspecified fall, initial encounter: Secondary | ICD-10-CM

## 2015-09-13 MED ORDER — OXYCODONE-ACETAMINOPHEN 5-325 MG PO TABS
1.0000 | ORAL_TABLET | Freq: Once | ORAL | Status: AC
  Administered 2015-09-13: 1 via ORAL
  Filled 2015-09-13: qty 1

## 2015-09-13 MED ORDER — KETOROLAC TROMETHAMINE 60 MG/2ML IM SOLN
60.0000 mg | Freq: Once | INTRAMUSCULAR | Status: AC
  Administered 2015-09-13: 60 mg via INTRAMUSCULAR
  Filled 2015-09-13: qty 2

## 2015-09-13 MED ORDER — MELOXICAM 7.5 MG PO TABS: 7.5000 mg | ORAL_TABLET | Freq: Every day | ORAL | Status: DC

## 2015-09-13 MED ORDER — CYCLOBENZAPRINE HCL 10 MG PO TABS: 10.0000 mg | ORAL_TABLET | Freq: Two times a day (BID) | ORAL | Status: DC | PRN

## 2015-09-13 NOTE — ED Notes (Signed)
Pt fell ~25 ft from a roof while applying shingles. States he landed on his back and is c/o upper back and shoulder pain. Denies any neck pain.

## 2015-09-13 NOTE — ED Notes (Signed)
MD Mesner at bedside  °

## 2015-09-13 NOTE — ED Provider Notes (Signed)
CSN: [id number]     Arrival date & time 09/13/15  1422 History   First MD Initiated Contact with Patient 09/13/15 1428     Chief Complaint  Patient presents with  . Fall     (Consider location/radiation/quality/duration/timing/severity/associated sxs/prior Treatment) HPI  29 year old male who claims that he fell 25 feet off of a roof and only landed on his shoulders. His only complaining of bilateral posterior shoulder pain and back pain in between as well. No alcohol or drug use. Larey SeatFell because he slipped while carrying shingles of the roof. Did not hit his head does not have any neck pain and loss of consciousness. Has a history of a left shoulder rod placement secondary to fractures. No  dyspnea or other symptoms at this time. Happened around 11:30 today and no changes since that time.  History reviewed. No pertinent past medical history. Past Surgical History  Procedure Laterality Date  . Shoulder surgery    . Im nailing humerus    . Finger    . Finger surgery     No family history on file. Social History  Substance Use Topics  . Smoking status: Never Smoker   . Smokeless tobacco: Current User    Types: Chew  . Alcohol Use: No    Review of Systems  Constitutional: Negative for fever, chills and fatigue.  Eyes: Negative for pain.  Respiratory: Negative for cough and shortness of breath.   Cardiovascular: Negative for chest pain.  Gastrointestinal: Negative for abdominal pain, abdominal distention and anal bleeding.  Musculoskeletal: Positive for back pain. Negative for myalgias, joint swelling, neck pain and neck stiffness.  All other systems reviewed and are negative.     Allergies  Review of patient's allergies indicates no known allergies.  Home Medications   Prior to Admission medications   Medication Sig Start Date End Date Taking? Authorizing Provider  cyclobenzaprine (FLEXERIL) 10 MG tablet Take 1 tablet (10 mg total) by mouth 2 (two) times daily as needed  for muscle spasms. 09/13/15   Marily MemosJason Geoge Lawrance, MD  HYDROcodone-acetaminophen (NORCO/VICODIN) 5-325 MG per tablet Take 1 tablet by mouth every 4 (four) hours as needed. 02/27/15   Hope Orlene OchM Neese, NP  meloxicam (MOBIC) 7.5 MG tablet Take 1-2 tablets (7.5-15 mg total) by mouth daily. 09/13/15   Barbara CowerJason Zyriah Mask, MD   BP 135/94 mmHg  Pulse 79  Temp(Src) 97.9 F (36.6 C) (Oral)  Resp 18  Ht 5\' 6"  (1.676 m)  Wt 180 lb (81.647 kg)  BMI 29.07 kg/m2  SpO2 99% Physical Exam  Constitutional: He is oriented to person, place, and time. He appears well-developed and well-nourished.  HENT:  Head: Normocephalic and atraumatic.  Eyes: Conjunctivae are normal. Pupils are equal, round, and reactive to light.  Neck: Normal range of motion.  Cardiovascular: Normal rate.   Pulmonary/Chest: Effort normal. No respiratory distress.  Abdominal: Soft. Bowel sounds are normal. He exhibits no distension. There is no tenderness. There is no rebound.  Musculoskeletal: Normal range of motion. He exhibits tenderness (bilateral posterior shoulders and trapezius/paraspinal muscles of thoracic spine).  Neurological: He is alert and oriented to person, place, and time. No cranial nerve deficit. Coordination normal.  Neuro  Normal upper extremities. Normal strength, sensation.   Skin: Skin is warm and dry. No rash noted.  Psychiatric: He has a normal mood and affect.  Nursing note and vitals reviewed.   ED Course  Procedures (including critical care time) Labs Review Labs Reviewed - No data to display  Imaging  Review Dg Thoracic Spine 2 View  09/13/2015  CLINICAL DATA:  Thoracic spine pain after falling from a roof yesterday. EXAM: THORACIC SPINE 2 VIEWS COMPARISON:  Chest x-ray dated 11/25/2005 FINDINGS: There is no evidence of thoracic spine fracture. Alignment is normal. No other significant bone abnormalities are identified. IMPRESSION: Negative. Electronically Signed   By: Francene Boyers M.D.   On: 09/13/2015 15:27   Dg  Scapula Left  09/13/2015  CLINICAL DATA:  Left scapula pain after falling from a roof yesterday. EXAM: LEFT SCAPULA - 2+ VIEWS COMPARISON:  04/09/2009 FINDINGS: There is no evidence of fracture or other focal bone lesions. Soft tissues are unremarkable. IMPRESSION: Negative. Electronically Signed   By: Francene Boyers M.D.   On: 09/13/2015 15:25   Dg Scapula Right  09/13/2015  CLINICAL DATA:  Upper back and shoulder pain status post 25 feet fall. EXAM: RIGHT SCAPULA - 2+ VIEWS COMPARISON:  None. FINDINGS: There is no evidence of fracture or other focal bone lesions. Soft tissues are unremarkable. IMPRESSION: Negative. Electronically Signed   By: Ted Mcalpine M.D.   On: 09/13/2015 15:25   I have personally reviewed and evaluated these images and lab results as part of my medical decision-making.   EKG Interpretation None      MDM   Final diagnoses:  Fall, initial encounter  Bilateral shoulder pain   Likely muscular pain from a questionable fall. No obvious fractures/deformities, will xr to ensure.   No fx on xr. Likely muscular. Will dc on flexeril and mobic (already takes at home)    Marily Memos, MD 09/13/15 1550

## 2015-11-22 ENCOUNTER — Emergency Department (HOSPITAL_COMMUNITY): Payer: Medicare Other

## 2015-11-22 ENCOUNTER — Encounter (HOSPITAL_COMMUNITY): Payer: Self-pay

## 2015-11-22 ENCOUNTER — Emergency Department (HOSPITAL_COMMUNITY)
Admission: EM | Admit: 2015-11-22 | Discharge: 2015-11-22 | Disposition: A | Discharge status: Home / Self Care | Payer: Medicare Other | Attending: Emergency Medicine | Admitting: Emergency Medicine

## 2015-11-22 DIAGNOSIS — Y998 Other external cause status: Secondary | ICD-10-CM | POA: Insufficient documentation

## 2015-11-22 DIAGNOSIS — Y9389 Activity, other specified: Secondary | ICD-10-CM | POA: Diagnosis not present

## 2015-11-22 DIAGNOSIS — Y9289 Other specified places as the place of occurrence of the external cause: Secondary | ICD-10-CM | POA: Insufficient documentation

## 2015-11-22 DIAGNOSIS — S29002A Unspecified injury of muscle and tendon of back wall of thorax, initial encounter: Secondary | ICD-10-CM | POA: Diagnosis present

## 2015-11-22 DIAGNOSIS — S20221A Contusion of right back wall of thorax, initial encounter: Secondary | ICD-10-CM

## 2015-11-22 DIAGNOSIS — W1789XA Other fall from one level to another, initial encounter: Secondary | ICD-10-CM | POA: Insufficient documentation

## 2015-11-22 DIAGNOSIS — Z791 Long term (current) use of non-steroidal anti-inflammatories (NSAID): Secondary | ICD-10-CM | POA: Insufficient documentation

## 2015-11-22 MED ORDER — OXYCODONE-ACETAMINOPHEN 5-325 MG PO TABS
2.0000 | ORAL_TABLET | Freq: Once | ORAL | Status: AC
  Administered 2015-11-22: 2 via ORAL
  Filled 2015-11-22: qty 2

## 2015-11-22 MED ORDER — IBUPROFEN 400 MG PO TABS
600.0000 mg | ORAL_TABLET | Freq: Once | ORAL | Status: AC
  Administered 2015-11-22: 600 mg via ORAL
  Filled 2015-11-22: qty 2

## 2015-11-22 NOTE — ED Notes (Signed)
Pt reports he fell off deck and landed on back. Approximately 6 ft. Complaining of pain right upper back. States he was coughing up bright red blood and painful to breathe

## 2015-11-22 NOTE — Discharge Instructions (Signed)
Your x-rays today do not show any broken bones. You do not have a collapsed lung. This is most likely contusion to your back/posterior ribs. Take NSAIDs such as ibuprofen, Aleve, Naprosyn, etc. as needed for continued pain.  Contusion A contusion is a deep bruise. Contusions are the result of a blunt injury to tissues and muscle fibers under the skin. The injury causes bleeding under the skin. The skin overlying the contusion may turn blue, purple, or yellow. Minor injuries will give you a painless contusion, but more severe contusions may stay painful and swollen for a few weeks.  CAUSES  This condition is usually caused by a blow, trauma, or direct force to an area of the body. SYMPTOMS  Symptoms of this condition include:  Swelling of the injured area.  Pain and tenderness in the injured area.  Discoloration. The area may have redness and then turn blue, purple, or yellow. DIAGNOSIS  This condition is diagnosed based on a physical exam and medical history. An X-ray, CT scan, or MRI may be needed to determine if there are any associated injuries, such as broken bones (fractures). TREATMENT  Specific treatment for this condition depends on what area of the body was injured. In general, the best treatment for a contusion is resting, icing, applying pressure to (compression), and elevating the injured area. This is often called the RICE strategy. Over-the-counter anti-inflammatory medicines may also be recommended for pain control.  HOME CARE INSTRUCTIONS   Rest the injured area.  If directed, apply ice to the injured area:  Put ice in a plastic bag.  Place a towel between your skin and the bag.  Leave the ice on for 20 minutes, 2-3 times per day.  If directed, apply light compression to the injured area using an elastic bandage. Make sure the bandage is not wrapped too tightly. Remove and reapply the bandage as directed by your health care provider.  If possible, raise (elevate) the  injured area above the level of your heart while you are sitting or lying down.  Take over-the-counter and prescription medicines only as told by your health care provider. SEEK MEDICAL CARE IF:  Your symptoms do not improve after several days of treatment.  Your symptoms get worse.  You have difficulty moving the injured area. SEEK IMMEDIATE MEDICAL CARE IF:   You have severe pain.  You have numbness in a hand or foot.  Your hand or foot turns pale or cold.   This information is not intended to replace advice given to you by your health care provider. Make sure you discuss any questions you have with your health care provider.   Document Released: 07/20/2005 Document Revised: 07/01/2015 Document Reviewed: 02/25/2015 Elsevier Interactive Patient Education 2016 [employer]  Blunt Chest Trauma Blunt chest trauma is an injury caused by a blow to the chest. These chest injuries can be very painful. Blunt chest trauma often results in bruised or broken (fractured) ribs. Most cases of bruised and fractured ribs from blunt chest traumas get better after 1 to 3 weeks of rest and pain medicine. Often, the soft tissue in the chest wall is also injured, causing pain and bruising. Internal organs, such as the heart and lungs, may also be injured. Blunt chest trauma can lead to serious medical problems. This injury requires immediate medical care. CAUSES   Motor vehicle collisions.  Falls.  Physical violence.  Sports injuries. SYMPTOMS   Chest pain. The pain may be worse when you move or breathe  deeply.  Shortness of breath.  Lightheadedness.  Bruising.  Tenderness.  Swelling. DIAGNOSIS  Your caregiver will do a physical exam. X-rays may be taken to look for fractures. However, minor rib fractures may not show up on X-rays until a few days after the injury. If a more serious injury is suspected, further imaging tests may be done. This may include ultrasounds, computed tomography  (CT) scans, or magnetic resonance imaging (MRI). TREATMENT  Treatment depends on the severity of your injury. Your caregiver may prescribe pain medicines and deep breathing exercises. HOME CARE INSTRUCTIONS  Limit your activities until you can move around without much pain.  Do not do any strenuous work until your injury is healed.  Put ice on the injured area.  Put ice in a plastic bag.  Place a towel between your skin and the bag.  Leave the ice on for 15-20 minutes, 03-04 times a day.  You may wear a rib belt as directed by your caregiver to reduce pain.  Practice deep breathing as directed by your caregiver to keep your lungs clear.  Only take over-the-counter or prescription medicines for pain, fever, or discomfort as directed by your caregiver. SEEK IMMEDIATE MEDICAL CARE IF:   You have increasing pain or shortness of breath.  You cough up blood.  You have nausea, vomiting, or abdominal pain.  You have a fever.  You feel dizzy, weak, or you faint. MAKE SURE YOU:  Understand these instructions.  Will watch your condition.  Will get help right away if you are not doing well or get worse.   This information is not intended to replace advice given to you by your health care provider. Make sure you discuss any questions you have with your health care provider.   Document Released: 11/17/2004 Document Revised: 10/31/2014 Document Reviewed: 04/08/2015 Elsevier Interactive Patient Education [employer]

## 2015-11-25 NOTE — ED Provider Notes (Addendum)
CSN: [id number]     Arrival date & time 11/22/15  1746 History   First MD Initiated Contact with Patient 11/22/15 1853     Chief Complaint  Patient presents with  . Back Pain     (Consider location/radiation/quality/duration/timing/severity/associated sxs/prior Treatment) HPI   30 year old male with right upper back pain. Patient reports a fall from his deck.He fell from approximately 6 feet onto his back. He reports that he struck his right scapular region on some boards.Pain is worse with movement and deep breathing. Initially coughed up a small amount of blood but none since. Denies any significant pain in the midline or anywhere else. Did not strike his head. No loss of consciousness. Has been ambulatorysince the fall with no significant difficulty. No acute numbness, tingling or focal loss of strength.  History reviewed. No pertinent past medical history. Past Surgical History  Procedure Laterality Date  . Shoulder surgery    . Im nailing humerus    . Finger    . Finger surgery     No family history on file. Social History  Substance Use Topics  . Smoking status: Never Smoker   . Smokeless tobacco: Current User    Types: Chew  . Alcohol Use: No    Review of Systems  All systems reviewed and negative, other than as noted in HPI.   Allergies  Bee venom  Home Medications   Prior to Admission medications   Medication Sig Start Date End Date Taking? Authorizing Provider  HYDROcodone-acetaminophen (NORCO/VICODIN) 5-325 MG per tablet Take 1 tablet by mouth every 4 (four) hours as needed. 02/27/15  Yes Hope Orlene Och, NP  cyclobenzaprine (FLEXERIL) 10 MG tablet Take 1 tablet (10 mg total) by mouth 2 (two) times daily as needed for muscle spasms. 09/13/15   Marily Memos, MD  meloxicam (MOBIC) 7.5 MG tablet Take 1-2 tablets (7.5-15 mg total) by mouth daily. 09/13/15   Marily Memos, MD   BP 131/89 mmHg  Pulse 86  Temp(Src) 98.9 F (37.2 C) (Temporal)  Resp 20  Ht   (1.651 m)  Wt 180 lb (81.647 kg)  BMI 29.95 kg/m2  SpO2 100% Physical Exam  Constitutional: He appears well-developed and well-nourished. No distress.  HENT:  Head: Normocephalic and atraumatic.  Eyes: Conjunctivae are normal. Right eye exhibits no discharge. Left eye exhibits no discharge.  Neck: Neck supple.  Cardiovascular: Normal rate, regular rhythm and normal heart sounds.  Exam reveals no gallop and no friction rub.   No murmur heard. Pulmonary/Chest: Effort normal and breath sounds normal. No respiratory distress.  Abdominal: Soft. He exhibits no distension. There is no tenderness.  Musculoskeletal: He exhibits no edema or tenderness.       Arms: tenderness to palpation in the pictured area. No overlying skin changes. No crepitus. Breath sounds are symmetric bilaterally with symmetric chest rise.  Neurological: He is alert.  Skin: Skin is warm and dry.  Psychiatric: He has a normal mood and affect. His behavior is normal. Thought content normal.  Nursing note and vitals reviewed.   ED Course  Procedures (including critical care time) Labs Review Labs Reviewed - No data to display  Imaging Review No results found.   Dg Chest 2 View  11/22/2015  CLINICAL DATA:  Larey Seat off deck and landed on back. 6 foot fall. Right upper back pain. EXAM: CHEST - 2 VIEW COMPARISON:  One-view chest x-ray 11/25/2005. Thoracic spine radiographs 09/12/2017. FINDINGS: The heart size and mediastinal contours are within normal limits.  Both lungs are clear. The visualized skeletal structures are unremarkable. IMPRESSION: Negative two view chest x-ray. Electronically Signed   By: Marin Roberts M.D.   On: 11/22/2015 19:14   I have personally reviewed and evaluated these images and lab results as part of my medical decision-making.   EKG Interpretation None      MDM   Final diagnoses:  Back contusion, right, initial encounter    30 year old male with back pain after fall. Likely  contusion. Negative imaging. No increased work of breathing. Oxygen saturations normal.Symptomatic treatment.    Raeford Razor, MD 11/25/15 1316  Raeford Razor, MD 11/25/15 [phone]

## 2016-03-02 ENCOUNTER — Emergency Department (HOSPITAL_COMMUNITY)
Admission: EM | Admit: 2016-03-02 | Discharge: 2016-03-02 | Disposition: A | Discharge status: Home / Self Care | Payer: Medicare Other | Attending: Dermatology | Admitting: Dermatology

## 2016-03-02 ENCOUNTER — Encounter (HOSPITAL_COMMUNITY): Payer: Self-pay

## 2016-03-02 DIAGNOSIS — M545 Low back pain: Secondary | ICD-10-CM | POA: Diagnosis present

## 2016-03-02 DIAGNOSIS — Z5321 Procedure and treatment not carried out due to patient leaving prior to being seen by health care provider: Secondary | ICD-10-CM | POA: Diagnosis not present

## 2016-03-02 NOTE — ED Notes (Signed)
Pt reports fell approx 12 feel and c/o sharp pain in middle of lower back.  Denies any difficulty breathing.  Denies hitting head or loss of consciousness.

## 2016-03-02 NOTE — ED Notes (Signed)
No answer x 3 in all waiting areas.

## 2016-03-02 NOTE — ED Notes (Signed)
No answer in waiting areas.

## 2018-04-11 ENCOUNTER — Emergency Department (HOSPITAL_COMMUNITY)
Admission: EM | Admit: 2018-04-11 | Discharge: 2018-04-11 | Disposition: A | Discharge status: Home / Self Care | Payer: Medicare Other | Attending: Emergency Medicine | Admitting: Emergency Medicine

## 2018-04-11 ENCOUNTER — Emergency Department (HOSPITAL_COMMUNITY): Payer: Medicare Other

## 2018-04-11 ENCOUNTER — Encounter (HOSPITAL_COMMUNITY): Payer: Self-pay | Admitting: Emergency Medicine

## 2018-04-11 ENCOUNTER — Other Ambulatory Visit: Payer: Self-pay

## 2018-04-11 DIAGNOSIS — Y999 Unspecified external cause status: Secondary | ICD-10-CM | POA: Diagnosis not present

## 2018-04-11 DIAGNOSIS — F17220 Nicotine dependence, chewing tobacco, uncomplicated: Secondary | ICD-10-CM | POA: Insufficient documentation

## 2018-04-11 DIAGNOSIS — Y929 Unspecified place or not applicable: Secondary | ICD-10-CM | POA: Diagnosis not present

## 2018-04-11 DIAGNOSIS — W109XXA Fall (on) (from) unspecified stairs and steps, initial encounter: Secondary | ICD-10-CM | POA: Insufficient documentation

## 2018-04-11 DIAGNOSIS — S60812A Abrasion of left wrist, initial encounter: Secondary | ICD-10-CM

## 2018-04-11 DIAGNOSIS — Y9301 Activity, walking, marching and hiking: Secondary | ICD-10-CM | POA: Diagnosis not present

## 2018-04-11 DIAGNOSIS — S6992XA Unspecified injury of left wrist, hand and finger(s), initial encounter: Secondary | ICD-10-CM | POA: Diagnosis present

## 2018-04-11 DIAGNOSIS — S63502A Unspecified sprain of left wrist, initial encounter: Secondary | ICD-10-CM | POA: Insufficient documentation

## 2018-04-11 MED ORDER — HYDROCODONE-ACETAMINOPHEN 5-325 MG PO TABS: 2.0000 | ORAL_TABLET | ORAL | 0 refills | Status: DC | PRN

## 2018-04-11 MED ORDER — HYDROMORPHONE HCL 2 MG/ML IJ SOLN
2.0000 mg | Freq: Once | INTRAMUSCULAR | Status: AC
  Administered 2018-04-11: 2 mg via INTRAMUSCULAR
  Filled 2018-04-11: qty 1

## 2018-04-11 NOTE — Discharge Instructions (Signed)
Keep splint on since you may have a hairline fracture to 1 of the bones in your wrist.  Make an appointment to follow-up with orthopedics.  Take the pain medication as directed and as provided to you tonight.  Wound care as provided here tonight.  He will not be to do that until the splint is removed.  Important to call orthopedics for follow-up.

## 2018-04-11 NOTE — ED Triage Notes (Signed)
Pt was walking down the stairs, fell and landed on left hand. Deformity noted. Bleeding controlled.

## 2018-04-11 NOTE — ED Provider Notes (Signed)
[facility] EMERGENCY DEPARTMENT Provider Note   CSN: [id number] Arrival date & time: 04/11/18  1843     History   Chief Complaint Chief Complaint  Patient presents with  . Hand Injury    HPI Edward Zamora is a 32 y.o. male.  Patient brought in by family member.  Patient walking downstairs fell and landed on left hand deformity and pain to the left wrist area.  Also has abrasions on the radial side of the wrist.  No deep lacerations.  No other injuries from the fall.  Patient states tetanus up-to-date.     History reviewed. No pertinent past medical history.  There are no active problems to display for this patient.   Past Surgical History:  Procedure Laterality Date  . finger    . FINGER SURGERY    . IM NAILING HUMERUS    . SHOULDER SURGERY          Home Medications    Prior to Admission medications   Medication Sig Start Date End Date Taking? Authorizing Provider  HYDROcodone-acetaminophen (NORCO/VICODIN) 5-325 MG tablet Take 2 tablets by mouth every 4 (four) hours as needed. 04/11/18   Vanetta MuldersZackowski, Sherrilyn Nairn, MD    Family History History reviewed. No pertinent family history.  Social History Social History   Tobacco Use  . Smoking status: Never Smoker  . Smokeless tobacco: Current User    Types: Chew  Substance Use Topics  . Alcohol use: No  . Drug use: Yes    Types: Marijuana     Allergies   Bee venom   Review of Systems Review of Systems  Constitutional: Negative for fever.  HENT: Negative for congestion.   Eyes: Negative for pain.  Respiratory: Negative for shortness of breath.   Cardiovascular: Negative for chest pain.  Gastrointestinal: Negative for abdominal pain.  Musculoskeletal: Negative for back pain and neck pain.  Skin: Positive for wound.  Neurological: Negative for syncope and headaches.  Hematological: Does not bruise/bleed easily.  Psychiatric/Behavioral: Negative for confusion.     Physical Exam Updated Vital Signs BP  (!) 145/96 (BP Location: Right Arm)   Pulse 80   Temp 98.1 F (36.7 C) (Oral)   Resp 20   Ht 1.651 m (5\' 5" )   Wt 90.7 kg (200 lb)   SpO2 96%   BMI 33.28 kg/m   Physical Exam  Constitutional: He is oriented to person, place, and time. He appears well-developed and well-nourished. No distress.  HENT:  Head: Normocephalic and atraumatic.  Mouth/Throat: Oropharynx is clear and moist.  Eyes: Pupils are equal, round, and reactive to light. Conjunctivae and EOM are normal.  Neck: Neck supple.  Cardiovascular: Normal rate.  Pulmonary/Chest: Effort normal and breath sounds normal.  Abdominal: Soft. Bowel sounds are normal. There is no tenderness.  Musculoskeletal: He exhibits edema, tenderness and deformity.  Remedies without any injury other than the left wrist area.  There are superficial abrasions lacerations around the base of the thumb and then a 1 x 2 cm abrasion over the wrist.  Appears to be deformity.  Patient does have some range of motion there.  There is some snuffbox tenderness.  Good cap refill to the fingers.  Good range of motion of fingers.  Able to move his elbow and shoulder fine.  Neurological: He is alert and oriented to person, place, and time. No cranial nerve deficit or sensory deficit. He exhibits normal muscle tone. Coordination normal.  Skin: Skin is warm.  Nursing note and vitals  reviewed.    ED Treatments / Results  Labs (all labs ordered are listed, but only abnormal results are displayed) Labs Reviewed - No data to display  EKG None  Radiology Dg Wrist Complete Left  Result Date: 04/11/2018 CLINICAL DATA:  Larey Seat onto outstretched hand today.  Left wrist pain. EXAM: LEFT WRIST - COMPLETE 3+ VIEW COMPARISON:  None. FINDINGS: There is no evidence of fracture or dislocation. There is no evidence of arthropathy or other focal bone abnormality. Soft tissues are unremarkable. IMPRESSION: Negative. Electronically Signed   By: Myles Rosenthal M.D.   On: 04/11/2018  19:41    Procedures Procedures (including critical care time)  Medications Ordered in ED Medications  HYDROmorphone (DILAUDID) injection 2 mg (2 mg Intramuscular Given 04/11/18 2041)     Initial Impression / Assessment and Plan / ED Course  I have reviewed the triage vital signs and the nursing notes.  Pertinent labs & imaging results that were available during my care of the patient were reviewed by me and considered in my medical decision making (see chart for details).    X-rays of the left wrist without any acute bony injuries.  However based on exam patient could have a navicular occult injury.  Patient be treated with thumb spica splint follow-up with orthopedics.  Wound stressed prior to placing splint.  Patient states tetanus is up-to-date.  After being given pain medication patient with better range of motion at the wrist but still has discomfort.  Patient stable for discharge home.   Final Clinical Impressions(s) / ED Diagnoses   Final diagnoses:  Sprain of left wrist, initial encounter  Abrasion of left wrist, initial encounter    ED Discharge Orders        Ordered    HYDROcodone-acetaminophen (NORCO/VICODIN) 5-325 MG tablet  Every 4 hours PRN     04/11/18 2146       Vanetta Mulders, MD 04/11/18 2152

## 2018-04-17 ENCOUNTER — Ambulatory Visit (INDEPENDENT_AMBULATORY_CARE_PROVIDER_SITE_OTHER): Payer: Medicare Other | Admitting: Orthopaedic Surgery

## 2018-04-17 ENCOUNTER — Encounter: Payer: Self-pay | Admitting: Orthopaedic Surgery

## 2018-04-17 VITALS — BP 139/80 | HR 81 | Ht 64.5 in | Wt 173.0 lb

## 2018-04-17 DIAGNOSIS — M654 Radial styloid tenosynovitis [de Quervain]: Secondary | ICD-10-CM

## 2018-04-17 NOTE — Progress Notes (Signed)
Subjective:    Patient ID: Edward Zamora, male    DOB: July 11, 1986, 32 y.o.   MRN: [id number]  HPI He tripped over his shoes and fell hurting his left nondominant wrist on 04-11-18.  He went to the ER.  X-rays were negative.  He was given a splint.  He has pain with motion of the left thumb.  He has no redness, no numbness, no weakness.  He works in [employer].  He is not getting any better.   Review of Systems  Musculoskeletal: Positive for arthralgias.  All other systems reviewed and are negative.  History reviewed. No pertinent past medical history.  Past Surgical History:  Procedure Laterality Date  . finger    . FINGER SURGERY    . IM NAILING HUMERUS    . SHOULDER SURGERY      Current Outpatient Medications on File Prior to Visit  Medication Sig Dispense Refill  . HYDROcodone-acetaminophen (NORCO/VICODIN) 5-325 MG tablet Take 2 tablets by mouth every 4 (four) hours as needed. 6 tablet 0   No current facility-administered medications on file prior to visit.     Social History   Socioeconomic History  . Marital status: Single    Spouse name: Not on file  . Number of children: Not on file  . Years of education: Not on file  . Highest education level: Not on file  Occupational History  . Not on file  Social Needs  . Financial resource strain: Not on file  . Food insecurity:    Worry: Not on file    Inability: Not on file  . Transportation needs:    Medical: Not on file    Non-medical: Not on file  Tobacco Use  . Smoking status: Never Smoker  . Smokeless tobacco: Current User    Types: Chew  Substance and Sexual Activity  . Alcohol use: No  . Drug use: Yes    Types: Marijuana  . Sexual activity: Never  Lifestyle  . Physical activity:    Days per week: Not on file    Minutes per session: Not on file  . Stress: Not on file  Relationships  . Social connections:    Talks on phone: Not on file    Gets together: Not on file    Attends religious service:  Not on file    Active member of club or organization: Not on file    Attends meetings of clubs or organizations: Not on file    Relationship status: Not on file  . Intimate partner violence:    Fear of current or ex partner: Not on file    Emotionally abused: Not on file    Physically abused: Not on file    Forced sexual activity: Not on file  Other Topics Concern  . Not on file  Social History Narrative  . Not on file    History reviewed. No pertinent family history.  BP 139/80   Pulse 81   Ht 5' 4.5" (1.638 m)   Wt 173 lb (78.5 kg)   BMI 29.24 kg/m      Objective:   Physical Exam  Constitutional: He is oriented to person, place, and time. He appears well-developed and well-nourished.  HENT:  Head: Normocephalic and atraumatic.  Eyes: Pupils are equal, round, and reactive to light. Conjunctivae and EOM are normal.  Neck: Normal range of motion. Neck supple.  Cardiovascular: Normal rate, regular rhythm and intact distal pulses.  Pulmonary/Chest: Effort normal.  Abdominal: Soft.  Musculoskeletal:       Left wrist: He exhibits decreased range of motion and tenderness.       Arms: Neurological: He is alert and oriented to person, place, and time. He has normal reflexes. He displays normal reflexes. No cranial nerve deficit. He exhibits normal muscle tone. Coordination normal.  Skin: Skin is warm and dry.  Psychiatric: He has a normal mood and affect. His behavior is normal. Judgment and thought content normal.    I have reviewed the ER records, X-rays and x-ray reports.      Assessment & Plan:   Encounter Diagnosis  Name Primary?  Tommi Rumps. De Quervain's disease (radial styloid tenosynovitis) Yes   I have explained what he has.  Procedure note: After permission from the patient and prepping of the first extensor compartment on the left wrist, I injected 1% plain Xylocaine and 1 cc of DepoMedrol 40 into the first extensor compartment by sterile technique tolerated  well.  Continue the splint.  Use Aspercreme to the area tid.  Return in one week.  Call if any problem.  Precautions discussed.   Electronically Signed Darreld McleanWayne Darshawn Boateng, MD 6/25/20192:08 PM

## 2018-04-24 ENCOUNTER — Ambulatory Visit: Payer: Medicare Other | Admitting: Orthopaedic Surgery

## 2018-05-01 ENCOUNTER — Ambulatory Visit (INDEPENDENT_AMBULATORY_CARE_PROVIDER_SITE_OTHER): Payer: Medicare Other | Admitting: Orthopaedic Surgery

## 2018-05-01 ENCOUNTER — Encounter: Payer: Self-pay | Admitting: Orthopaedic Surgery

## 2018-05-01 DIAGNOSIS — M654 Radial styloid tenosynovitis [de Quervain]: Secondary | ICD-10-CM

## 2018-05-01 NOTE — Progress Notes (Signed)
CC:  My pain is all gone  He is doing well.  He has no pain in the left wrist area.  He has full ROM. He has no pain over the left first extensor compartment.  NV intact.  Encounter Diagnosis  Name Primary?  Edward Zamora. De Quervain's disease (radial styloid tenosynovitis) Yes   I will see as needed.  Call if any problem.  Precautions discussed.   Electronically Signed Darreld McleanWayne Kavita Bartl, MD 7/9/20193:10 PM

## 2018-05-21 ENCOUNTER — Emergency Department (HOSPITAL_COMMUNITY)
Admission: EM | Admit: 2018-05-21 | Discharge: 2018-05-21 | Disposition: A | Discharge status: Home / Self Care | Payer: Medicare Other | Attending: Emergency Medicine | Admitting: Emergency Medicine

## 2018-05-21 ENCOUNTER — Emergency Department (HOSPITAL_COMMUNITY): Payer: Medicare Other

## 2018-05-21 ENCOUNTER — Encounter (HOSPITAL_COMMUNITY): Payer: Self-pay

## 2018-05-21 ENCOUNTER — Other Ambulatory Visit: Payer: Self-pay

## 2018-05-21 DIAGNOSIS — S61411A Laceration without foreign body of right hand, initial encounter: Secondary | ICD-10-CM | POA: Diagnosis not present

## 2018-05-21 DIAGNOSIS — Y939 Activity, unspecified: Secondary | ICD-10-CM | POA: Diagnosis not present

## 2018-05-21 DIAGNOSIS — Y929 Unspecified place or not applicable: Secondary | ICD-10-CM | POA: Diagnosis not present

## 2018-05-21 DIAGNOSIS — Z23 Encounter for immunization: Secondary | ICD-10-CM | POA: Diagnosis not present

## 2018-05-21 DIAGNOSIS — S161XXA Strain of muscle, fascia and tendon at neck level, initial encounter: Secondary | ICD-10-CM | POA: Insufficient documentation

## 2018-05-21 DIAGNOSIS — Y999 Unspecified external cause status: Secondary | ICD-10-CM | POA: Insufficient documentation

## 2018-05-21 DIAGNOSIS — Z79899 Other long term (current) drug therapy: Secondary | ICD-10-CM | POA: Insufficient documentation

## 2018-05-21 DIAGNOSIS — S6991XA Unspecified injury of right wrist, hand and finger(s), initial encounter: Secondary | ICD-10-CM | POA: Diagnosis present

## 2018-05-21 MED ORDER — TETANUS-DIPHTH-ACELL PERTUSSIS 5-2.5-18.5 LF-MCG/0.5 IM SUSP
0.5000 mL | Freq: Once | INTRAMUSCULAR | Status: AC
  Administered 2018-05-21: 0.5 mL via INTRAMUSCULAR
  Filled 2018-05-21: qty 0.5

## 2018-05-21 MED ORDER — POVIDONE-IODINE 10 % EX SOLN
CUTANEOUS | Status: DC | PRN
  Administered 2018-05-21: 1 via TOPICAL
  Filled 2018-05-21: qty 15

## 2018-05-21 MED ORDER — LIDOCAINE HCL (PF) 2 % IJ SOLN
INTRAMUSCULAR | Status: AC
  Filled 2018-05-21: qty 10

## 2018-05-21 MED ORDER — LIDOCAINE HCL (PF) 2 % IJ SOLN
5.0000 mL | Freq: Once | INTRAMUSCULAR | Status: AC
  Administered 2018-05-21: 5 mL via INTRADERMAL

## 2018-05-21 NOTE — Discharge Instructions (Addendum)
Clean the wound with mild soap and water and keep it bandaged.  Sutures out in 10 days.  Return to the ER for any signs of infection.  Tylenol or ibuprofen if needed for pain.
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
## 2018-12-01 ENCOUNTER — Emergency Department (HOSPITAL_COMMUNITY): Payer: Medicare Other

## 2018-12-01 ENCOUNTER — Other Ambulatory Visit: Payer: Self-pay

## 2018-12-01 ENCOUNTER — Encounter (HOSPITAL_COMMUNITY): Payer: Self-pay

## 2018-12-01 ENCOUNTER — Emergency Department (HOSPITAL_COMMUNITY)
Admission: EM | Admit: 2018-12-01 | Discharge: 2018-12-01 | Disposition: A | Discharge status: Home / Self Care | Payer: Medicare Other | Attending: Emergency Medicine | Admitting: Emergency Medicine

## 2018-12-01 DIAGNOSIS — R091 Pleurisy: Secondary | ICD-10-CM | POA: Insufficient documentation

## 2018-12-01 DIAGNOSIS — R1030 Lower abdominal pain, unspecified: Secondary | ICD-10-CM | POA: Diagnosis present

## 2018-12-01 LAB — URINALYSIS, ROUTINE W REFLEX MICROSCOPIC
BILIRUBIN URINE: NEGATIVE
Glucose, UA: NEGATIVE mg/dL
Hgb urine dipstick: NEGATIVE
Ketones, ur: NEGATIVE mg/dL
Leukocytes, UA: NEGATIVE
Nitrite: NEGATIVE
Protein, ur: NEGATIVE mg/dL
Specific Gravity, Urine: 1.025 (ref 1.005–1.030)
pH: 5.5 (ref 5.0–8.0)

## 2018-12-01 LAB — CBC WITH DIFFERENTIAL/PLATELET
Abs Immature Granulocytes: 0.02 10*3/uL (ref 0.00–0.07)
BASOS PCT: 1 %
Basophils Absolute: 0.1 10*3/uL (ref 0.0–0.1)
Eosinophils Absolute: 0 10*3/uL (ref 0.0–0.5)
Eosinophils Relative: 1 %
HCT: 51.9 % (ref 39.0–52.0)
Hemoglobin: 16.7 g/dL (ref 13.0–17.0)
Immature Granulocytes: 0 %
Lymphocytes Relative: 27 %
Lymphs Abs: 1.8 10*3/uL (ref 0.7–4.0)
MCH: 28.2 pg (ref 26.0–34.0)
MCHC: 32.2 g/dL (ref 30.0–36.0)
MCV: 87.5 fL (ref 80.0–100.0)
MONOS PCT: 11 %
Monocytes Absolute: 0.8 10*3/uL (ref 0.1–1.0)
NEUTROS ABS: 4 10*3/uL (ref 1.7–7.7)
Neutrophils Relative %: 60 %
Platelets: 227 10*3/uL (ref 150–400)
RBC: 5.93 MIL/uL — ABNORMAL HIGH (ref 4.22–5.81)
RDW: 12.6 % (ref 11.5–15.5)
WBC: 6.6 10*3/uL (ref 4.0–10.5)
nRBC: 0 % (ref 0.0–0.2)

## 2018-12-01 LAB — COMPREHENSIVE METABOLIC PANEL
ALT: 81 U/L — ABNORMAL HIGH (ref 0–44)
AST: 42 U/L — ABNORMAL HIGH (ref 15–41)
Albumin: 4.2 g/dL (ref 3.5–5.0)
Alkaline Phosphatase: 64 U/L (ref 38–126)
Anion gap: 10 (ref 5–15)
BUN: 13 mg/dL (ref 6–20)
CO2: 24 mmol/L (ref 22–32)
Calcium: 9.3 mg/dL (ref 8.9–10.3)
Chloride: 104 mmol/L (ref 98–111)
Creatinine, Ser: 1.13 mg/dL (ref 0.61–1.24)
GFR calc Af Amer: >60 mL/min (ref >60)
GFR calc non Af Amer: >60 mL/min (ref >60)
Glucose, Bld: 105 mg/dL — ABNORMAL HIGH (ref 70–99)
POTASSIUM: 4.5 mmol/L (ref 3.5–5.1)
Sodium: 138 mmol/L (ref 135–145)
Total Bilirubin: 0.5 mg/dL (ref 0.3–1.2)
Total Protein: 7.8 g/dL (ref 6.5–8.1)

## 2018-12-01 LAB — D-DIMER, QUANTITATIVE: D-Dimer, Quant: 0.28 ug/mL-FEU (ref 0.00–0.50)

## 2018-12-01 MED ORDER — NAPROXEN 500 MG PO TABS: 500.0000 mg | ORAL_TABLET | Freq: Two times a day (BID) | ORAL | 0 refills | Status: DC

## 2018-12-01 MED ORDER — TRAMADOL HCL 50 MG PO TABS: 50.0000 mg | ORAL_TABLET | Freq: Four times a day (QID) | ORAL | 0 refills | Status: DC | PRN

## 2018-12-01 NOTE — ED Provider Notes (Signed)
[facility] EMERGENCY DEPARTMENT Provider Note   CSN: [id number] Arrival date & time: 12/01/18  1451     History   Chief Complaint Chief Complaint  Patient presents with  . Back Pain    HPI Edward Zamora is a 33 y.o. male.  Patient complains of  right back pain specially with inspiration.  The history is provided by the patient. No language interpreter was used.  Flank Pain  This is a new problem. The current episode started 12 to 24 hours ago. The problem occurs constantly. The problem has not changed since onset.Pertinent negatives include no chest pain, no abdominal pain and no headaches. Exacerbated by: Breathing. Nothing relieves the symptoms. The treatment provided no relief.    History reviewed. No pertinent past medical history.  There are no active problems to display for this patient.   Past Surgical History:  Procedure Laterality Date  . finger    . FINGER SURGERY    . IM NAILING HUMERUS    . SHOULDER SURGERY          Home Medications    Prior to Admission medications   Medication Sig Start Date End Date Taking? Authorizing Provider  HYDROcodone-acetaminophen (NORCO/VICODIN) 5-325 MG tablet Take 2 tablets by mouth every 4 (four) hours as needed. 04/11/18   Vanetta Mulders, MD  naproxen (NAPROSYN) 500 MG tablet Take 1 tablet (500 mg total) by mouth 2 (two) times daily. 12/01/18   Bethann Berkshire, MD  traMADol (ULTRAM) 50 MG tablet Take 1 tablet (50 mg total) by mouth every 6 (six) hours as needed. 12/01/18   Bethann Berkshire, MD    Family History No family history on file.  Social History Social History   Tobacco Use  . Smoking status: Never Smoker  . Smokeless tobacco: Current User    Types: Chew  Substance Use Topics  . Alcohol use: No  . Drug use: Not Currently    Types: Marijuana     Allergies   Bee venom   Review of Systems Review of Systems  Constitutional: Negative for appetite change and fatigue.  HENT: Negative for congestion, ear  discharge and sinus pressure.   Eyes: Negative for discharge.  Respiratory: Negative for cough.   Cardiovascular: Negative for chest pain.  Gastrointestinal: Negative for abdominal pain and diarrhea.  Genitourinary: Positive for flank pain. Negative for frequency and hematuria.  Musculoskeletal: Negative for back pain.  Skin: Negative for rash.  Neurological: Negative for seizures and headaches.  Psychiatric/Behavioral: Negative for hallucinations.     Physical Exam Updated Vital Signs BP (!) 126/91 (BP Location: Right Arm)   Pulse 97   Temp 98.7 F (37.1 C) (Oral)   Resp 16   Ht 5\' 7"  (1.702 m)   Wt 81.6 kg   SpO2 98%   BMI 28.19 kg/m   Physical Exam Vitals signs and nursing note reviewed.  Constitutional:      Appearance: He is well-developed.  HENT:     Head: Normocephalic.     Nose: Nose normal.  Eyes:     General: No scleral icterus.    Conjunctiva/sclera: Conjunctivae normal.  Neck:     Musculoskeletal: Neck supple.     Thyroid: No thyromegaly.  Cardiovascular:     Rate and Rhythm: Normal rate and regular rhythm.     Heart sounds: No murmur. No friction rub. No gallop.   Pulmonary:     Breath sounds: No stridor. No wheezing or rales.  Chest:     Chest  wall: No tenderness.  Abdominal:     General: There is no distension.     Tenderness: There is no abdominal tenderness. There is no rebound.  Musculoskeletal: Normal range of motion.     Comments: Mild tenderness to flank on right  Lymphadenopathy:     Cervical: No cervical adenopathy.  Skin:    Findings: No erythema or rash.  Neurological:     Mental Status: He is oriented to person, place, and time.     Motor: No abnormal muscle tone.     Coordination: Coordination normal.  Psychiatric:        Behavior: Behavior normal.      ED Treatments / Results  Labs (all labs ordered are listed, but only abnormal results are displayed) Labs Reviewed  CBC WITH DIFFERENTIAL/PLATELET - Abnormal; Notable for  the following components:      Result Value   RBC 5.93 (*)    All other components within normal limits  COMPREHENSIVE METABOLIC PANEL - Abnormal; Notable for the following components:   Glucose, Bld 105 (*)    AST 42 (*)    ALT 81 (*)    All other components within normal limits  URINALYSIS, ROUTINE W REFLEX MICROSCOPIC  D-DIMER, QUANTITATIVE (NOT AT [facility])    EKG None  Radiology Dg Chest 2 View  Result Date: 12/01/2018 CLINICAL DATA:  Shortness of breath, trouble breathing today, back pain EXAM: CHEST - 2 VIEW COMPARISON:  11/22/2015 FINDINGS: Normal heart size, mediastinal contours, and pulmonary vascularity. Lungs clear. No pleural effusion or pneumothorax. IM nail present at the LEFT humerus. Bones otherwise unremarkable. IMPRESSION: No acute abnormalities. Electronically Signed   By: Ulyses SouthwardMark  Boles M.D.   On: 12/01/2018 18:10    Procedures Procedures (including critical care time)  Medications Ordered in ED Medications - No data to display   Initial Impression / Assessment and Plan / ED Course  I have reviewed the triage vital signs and the nursing notes.  Pertinent labs & imaging results that were available during my care of the patient were reviewed by me and considered in my medical decision making (see chart for details).     Labs unremarkable including urinalysis.  Patient will be treated for pleuritis with Naprosyn and Ultram and will follow-up as needed  Final Clinical Impressions(s) / ED Diagnoses   Final diagnoses:  Pleuritis    ED Discharge Orders         Ordered    naproxen (NAPROSYN) 500 MG tablet  2 times daily     12/01/18 1927    traMADol (ULTRAM) 50 MG tablet  Every 6 hours PRN     12/01/18 1927           Bethann BerkshireZammit, Theophilus Walz, MD 12/01/18 1933

## 2018-12-01 NOTE — Discharge Instructions (Addendum)
Follow-up with family doctor in a week if not improving

## 2018-12-01 NOTE — ED Triage Notes (Addendum)
Pt reports that his lower back is hurting and it hurts worse to breathe. Reports he woke up with the pain. Pt reports sharp pain when he breathes. Denies coughing

## 2019-06-26 ENCOUNTER — Encounter (HOSPITAL_COMMUNITY)
Admission: EM | Disposition: A | Discharge status: Home / Self Care | Payer: Self-pay | Source: Home / Self Care | Attending: Emergency Medicine

## 2019-06-26 ENCOUNTER — Ambulatory Visit (HOSPITAL_COMMUNITY)
Admission: EM | Admit: 2019-06-26 | Discharge: 2019-06-26 | Disposition: A | Discharge status: Home / Self Care | Payer: Medicare HMO | Attending: Emergency Medicine | Admitting: Emergency Medicine

## 2019-06-26 ENCOUNTER — Other Ambulatory Visit: Payer: Self-pay

## 2019-06-26 ENCOUNTER — Emergency Department (HOSPITAL_COMMUNITY): Payer: Medicare HMO

## 2019-06-26 ENCOUNTER — Emergency Department (HOSPITAL_COMMUNITY): Payer: Medicare HMO | Admitting: Certified Registered Nurse Anesthetist

## 2019-06-26 DIAGNOSIS — W3189XA Contact with other specified machinery, initial encounter: Secondary | ICD-10-CM | POA: Diagnosis not present

## 2019-06-26 DIAGNOSIS — S68119A Complete traumatic metacarpophalangeal amputation of unspecified finger, initial encounter: Secondary | ICD-10-CM

## 2019-06-26 DIAGNOSIS — S68115A Complete traumatic metacarpophalangeal amputation of left ring finger, initial encounter: Secondary | ICD-10-CM | POA: Diagnosis not present

## 2019-06-26 DIAGNOSIS — Z03818 Encounter for observation for suspected exposure to other biological agents ruled out: Secondary | ICD-10-CM | POA: Diagnosis not present

## 2019-06-26 DIAGNOSIS — S68625A Partial traumatic transphalangeal amputation of left ring finger, initial encounter: Secondary | ICD-10-CM | POA: Diagnosis not present

## 2019-06-26 DIAGNOSIS — Z89022 Acquired absence of left finger(s): Secondary | ICD-10-CM | POA: Diagnosis not present

## 2019-06-26 DIAGNOSIS — R0902 Hypoxemia: Secondary | ICD-10-CM | POA: Diagnosis not present

## 2019-06-26 DIAGNOSIS — R58 Hemorrhage, not elsewhere classified: Secondary | ICD-10-CM | POA: Diagnosis not present

## 2019-06-26 DIAGNOSIS — R52 Pain, unspecified: Secondary | ICD-10-CM | POA: Diagnosis not present

## 2019-06-26 DIAGNOSIS — S68615A Complete traumatic transphalangeal amputation of left ring finger, initial encounter: Secondary | ICD-10-CM | POA: Insufficient documentation

## 2019-06-26 DIAGNOSIS — Z20828 Contact with and (suspected) exposure to other viral communicable diseases: Secondary | ICD-10-CM | POA: Insufficient documentation

## 2019-06-26 HISTORY — PX: I & D EXTREMITY: SHX5045

## 2019-06-26 HISTORY — PX: AMPUTATION: SHX166

## 2019-06-26 LAB — BASIC METABOLIC PANEL
Anion gap: 11 (ref 5–15)
BUN: 24 mg/dL — ABNORMAL HIGH (ref 6–20)
CO2: 22 mmol/L (ref 22–32)
Calcium: 8.7 mg/dL — ABNORMAL LOW (ref 8.9–10.3)
Chloride: 103 mmol/L (ref 98–111)
Creatinine, Ser: 1.27 mg/dL — ABNORMAL HIGH (ref 0.61–1.24)
GFR calc Af Amer: >60 mL/min (ref >60)
GFR calc non Af Amer: >60 mL/min (ref >60)
Glucose, Bld: 80 mg/dL (ref 70–99)
Potassium: 4.4 mmol/L (ref 3.5–5.1)
Sodium: 136 mmol/L (ref 135–145)

## 2019-06-26 LAB — CBC
HCT: 45.7 % (ref 39.0–52.0)
Hemoglobin: 15 g/dL (ref 13.0–17.0)
MCH: 29.4 pg (ref 26.0–34.0)
MCHC: 32.8 g/dL (ref 30.0–36.0)
MCV: 89.6 fL (ref 80.0–100.0)
Platelets: 287 10*3/uL (ref 150–400)
RBC: 5.1 MIL/uL (ref 4.22–5.81)
RDW: 12.6 % (ref 11.5–15.5)
WBC: 12.6 10*3/uL — ABNORMAL HIGH (ref 4.0–10.5)
nRBC: 0 % (ref 0.0–0.2)

## 2019-06-26 LAB — SARS CORONAVIRUS 2 BY RT PCR (HOSPITAL ORDER, PERFORMED IN ~~LOC~~ HOSPITAL LAB): SARS Coronavirus 2: NEGATIVE

## 2019-06-26 SURGERY — AMPUTATION, FOOT, RAY
Anesthesia: General | Site: Finger | Laterality: Left

## 2019-06-26 SURGERY — Surgical Case
Anesthesia: *Unknown

## 2019-06-26 MED ORDER — ONDANSETRON HCL 4 MG/2ML IJ SOLN: 4.0000 mg | Freq: Once | INTRAMUSCULAR | Status: DC | PRN

## 2019-06-26 MED ORDER — DEXAMETHASONE SODIUM PHOSPHATE 4 MG/ML IJ SOLN
INTRAMUSCULAR | Status: DC | PRN
  Administered 2019-06-26: 5 mg via INTRAVENOUS

## 2019-06-26 MED ORDER — PROPOFOL 10 MG/ML IV BOLUS
INTRAVENOUS | Status: DC | PRN
  Administered 2019-06-26: 200 mg via INTRAVENOUS

## 2019-06-26 MED ORDER — PROPOFOL 10 MG/ML IV BOLUS
INTRAVENOUS | Status: AC
  Filled 2019-06-26: qty 20

## 2019-06-26 MED ORDER — ONDANSETRON HCL 4 MG/2ML IJ SOLN
INTRAMUSCULAR | Status: DC | PRN
  Administered 2019-06-26: 4 mg via INTRAVENOUS

## 2019-06-26 MED ORDER — LACTATED RINGERS IV SOLN
INTRAVENOUS | Status: DC | PRN
  Administered 2019-06-26: 22:00:00 via INTRAVENOUS

## 2019-06-26 MED ORDER — KETOROLAC TROMETHAMINE 30 MG/ML IJ SOLN
INTRAMUSCULAR | Status: AC
  Filled 2019-06-26: qty 1

## 2019-06-26 MED ORDER — MIDAZOLAM HCL 2 MG/2ML IJ SOLN
INTRAMUSCULAR | Status: DC | PRN
  Administered 2019-06-26: 2 mg via INTRAVENOUS

## 2019-06-26 MED ORDER — DEXAMETHASONE SODIUM PHOSPHATE 10 MG/ML IJ SOLN
INTRAMUSCULAR | Status: AC
  Filled 2019-06-26: qty 1

## 2019-06-26 MED ORDER — OXYCODONE HCL 5 MG/5ML PO SOLN: 5.0000 mg | Freq: Once | ORAL | Status: DC | PRN

## 2019-06-26 MED ORDER — TETANUS-DIPHTH-ACELL PERTUSSIS 5-2.5-18.5 LF-MCG/0.5 IM SUSP
0.5000 mL | Freq: Once | INTRAMUSCULAR | Status: AC
  Administered 2019-06-26: 0.5 mL via INTRAMUSCULAR
  Filled 2019-06-26: qty 0.5

## 2019-06-26 MED ORDER — CEPHALEXIN 500 MG PO CAPS: 500.0000 mg | ORAL_CAPSULE | Freq: Four times a day (QID) | ORAL | 0 refills | Status: AC

## 2019-06-26 MED ORDER — OXYCODONE-ACETAMINOPHEN 5-325 MG PO TABS: 1.0000 | ORAL_TABLET | ORAL | 0 refills | Status: AC | PRN

## 2019-06-26 MED ORDER — LIDOCAINE HCL 2 % IJ SOLN
10.0000 mL | Freq: Once | INTRAMUSCULAR | Status: AC
  Administered 2019-06-26: 200 mg
  Filled 2019-06-26: qty 20

## 2019-06-26 MED ORDER — FENTANYL CITRATE (PF) 100 MCG/2ML IJ SOLN
INTRAMUSCULAR | Status: DC | PRN
  Administered 2019-06-26 (×2): 50 ug via INTRAVENOUS

## 2019-06-26 MED ORDER — BUPIVACAINE HCL (PF) 0.25 % IJ SOLN
INTRAMUSCULAR | Status: DC | PRN
  Administered 2019-06-26: 7 mL

## 2019-06-26 MED ORDER — LIDOCAINE HCL (CARDIAC) PF 100 MG/5ML IV SOSY
PREFILLED_SYRINGE | INTRAVENOUS | Status: DC | PRN
  Administered 2019-06-26: 60 mg via INTRAVENOUS

## 2019-06-26 MED ORDER — FENTANYL CITRATE (PF) 250 MCG/5ML IJ SOLN
INTRAMUSCULAR | Status: AC
  Filled 2019-06-26: qty 5

## 2019-06-26 MED ORDER — 0.9 % SODIUM CHLORIDE (POUR BTL) OPTIME
TOPICAL | Status: DC | PRN
  Administered 2019-06-26: 22:00:00 1000 mL

## 2019-06-26 MED ORDER — CEFAZOLIN SODIUM-DEXTROSE 2-4 GM/100ML-% IV SOLN
INTRAVENOUS | Status: AC
  Filled 2019-06-26: qty 100

## 2019-06-26 MED ORDER — CEFAZOLIN SODIUM-DEXTROSE 2-3 GM-%(50ML) IV SOLR
INTRAVENOUS | Status: DC | PRN
  Administered 2019-06-26: 2 g via INTRAVENOUS

## 2019-06-26 MED ORDER — OXYCODONE HCL 5 MG PO TABS: 5.0000 mg | ORAL_TABLET | Freq: Once | ORAL | Status: DC | PRN

## 2019-06-26 MED ORDER — BUPIVACAINE HCL (PF) 0.25 % IJ SOLN
INTRAMUSCULAR | Status: AC
  Filled 2019-06-26: qty 30

## 2019-06-26 MED ORDER — SUCCINYLCHOLINE CHLORIDE 200 MG/10ML IV SOSY
PREFILLED_SYRINGE | INTRAVENOUS | Status: AC
  Filled 2019-06-26: qty 10

## 2019-06-26 MED ORDER — FENTANYL CITRATE (PF) 100 MCG/2ML IJ SOLN: 25.0000 ug | INTRAMUSCULAR | Status: DC | PRN

## 2019-06-26 MED ORDER — MIDAZOLAM HCL 2 MG/2ML IJ SOLN
INTRAMUSCULAR | Status: AC
  Filled 2019-06-26: qty 2

## 2019-06-26 SURGICAL SUPPLY — 50 items
BNDG COHESIVE 1X5 TAN STRL LF (GAUZE/BANDAGES/DRESSINGS) ×3 IMPLANT
BNDG CONFORM 2 STRL LF (GAUZE/BANDAGES/DRESSINGS) IMPLANT
BNDG ELASTIC 2X5.8 VLCR STR LF (GAUZE/BANDAGES/DRESSINGS) ×3 IMPLANT
BNDG ELASTIC 3X5.8 VLCR STR LF (GAUZE/BANDAGES/DRESSINGS) IMPLANT
BNDG ELASTIC 4X5.8 VLCR STR LF (GAUZE/BANDAGES/DRESSINGS) IMPLANT
BNDG GAUZE ELAST 4 BULKY (GAUZE/BANDAGES/DRESSINGS) ×1 IMPLANT
CORD BIPOLAR FORCEPS 12FT (ELECTRODE) ×3 IMPLANT
COVER SURGICAL LIGHT HANDLE (MISCELLANEOUS) ×3 IMPLANT
COVER WAND RF STERILE (DRAPES) ×3 IMPLANT
CUFF TOURN SGL QUICK 18X4 (TOURNIQUET CUFF) ×3 IMPLANT
CUFF TOURN SGL QUICK 24 (TOURNIQUET CUFF)
CUFF TRNQT CYL 24X4X16.5-23 (TOURNIQUET CUFF) IMPLANT
DRAPE SURG 17X23 STRL (DRAPES) ×3 IMPLANT
DRSG ADAPTIC 3X8 NADH LF (GAUZE/BANDAGES/DRESSINGS) ×1 IMPLANT
DRSG EMULSION OIL 3X3 NADH (GAUZE/BANDAGES/DRESSINGS) ×2 IMPLANT
GAUZE SPONGE 2X2 8PLY STRL LF (GAUZE/BANDAGES/DRESSINGS) IMPLANT
GAUZE SPONGE 4X4 12PLY STRL (GAUZE/BANDAGES/DRESSINGS) IMPLANT
GLOVE BIOGEL PI IND STRL 8.5 (GLOVE) ×1 IMPLANT
GLOVE BIOGEL PI INDICATOR 8.5 (GLOVE) ×2
GLOVE SURG ORTHO 8.0 STRL STRW (GLOVE) ×3 IMPLANT
GOWN STRL REUS W/ TWL LRG LVL3 (GOWN DISPOSABLE) ×2 IMPLANT
GOWN STRL REUS W/ TWL XL LVL3 (GOWN DISPOSABLE) ×1 IMPLANT
GOWN STRL REUS W/TWL LRG LVL3 (GOWN DISPOSABLE) ×6
GOWN STRL REUS W/TWL XL LVL3 (GOWN DISPOSABLE) ×3
KIT BASIN OR (CUSTOM PROCEDURE TRAY) ×3 IMPLANT
KIT TURNOVER KIT B (KITS) ×3 IMPLANT
MANIFOLD NEPTUNE II (INSTRUMENTS) ×3 IMPLANT
NDL HYPO 25GX1X1/2 BEV (NEEDLE) IMPLANT
NEEDLE HYPO 25GX1X1/2 BEV (NEEDLE) ×3 IMPLANT
NS IRRIG 1000ML POUR BTL (IV SOLUTION) ×3 IMPLANT
PACK ORTHO EXTREMITY (CUSTOM PROCEDURE TRAY) ×3 IMPLANT
PAD ARMBOARD 7.5X6 YLW CONV (MISCELLANEOUS) ×6 IMPLANT
PAD CAST 4YDX4 CTTN HI CHSV (CAST SUPPLIES) IMPLANT
PADDING CAST COTTON 4X4 STRL (CAST SUPPLIES)
SOAP 2 % CHG 4 OZ (WOUND CARE) ×3 IMPLANT
SPECIMEN JAR SMALL (MISCELLANEOUS) ×1 IMPLANT
SPLINT FINGER (SOFTGOODS) ×2 IMPLANT
SPONGE GAUZE 2X2 8PLY STER LF (GAUZE/BANDAGES/DRESSINGS) ×1
SPONGE GAUZE 2X2 8PLY STRL LF (GAUZE/BANDAGES/DRESSINGS) ×1 IMPLANT
SPONGE GAUZE 2X2 STER 10/PKG (GAUZE/BANDAGES/DRESSINGS)
SUCTION FRAZIER HANDLE 10FR (MISCELLANEOUS)
SUCTION TUBE FRAZIER 10FR DISP (MISCELLANEOUS) IMPLANT
SUT MERSILENE 4 0 P 3 (SUTURE) IMPLANT
SUT PROLENE 4 0 PS 2 18 (SUTURE) ×4 IMPLANT
SYR CONTROL 10ML LL (SYRINGE) ×2 IMPLANT
TOWEL GREEN STERILE (TOWEL DISPOSABLE) ×3 IMPLANT
TOWEL GREEN STERILE FF (TOWEL DISPOSABLE) ×3 IMPLANT
TUBE CONNECTING 12'X1/4 (SUCTIONS) ×1
TUBE CONNECTING 12X1/4 (SUCTIONS) ×1 IMPLANT
WATER STERILE IRR 1000ML POUR (IV SOLUTION) ×3 IMPLANT

## 2019-06-26 NOTE — Discharge Instructions (Signed)
KEEP BANDAGE CLEAN AND DRY °CALL OFFICE FOR F/U APPT [phone] IN 8 DAYS °KEEP HAND ELEVATED ABOVE HEART °OK TO APPLY ICE TO OPERATIVE AREA °CONTACT OFFICE IF ANY WORSENING PAIN OR CONCERNS. °

## 2019-06-26 NOTE — ED Notes (Signed)
X-ray at bedside

## 2019-06-26 NOTE — Op Note (Signed)
PREOPERATIVE DIAGNOSIS: Left ring finger open distal phalanx fracture  POSTOPERATIVE DIAGNOSIS: Same  ATTENDING SURGEON: Dr. Iran Planas who is scrubbed and present for the entire procedure  ASSISTANT SURGEON: None  ANESTHESIA: General via laryngeal mask airway  OPERATIVE PROCEDURE: #1: Open debridement of skin subcutaneous tissue and bone associated with open fracture left ring finger distal phalanx #2: Revision amputation with local neurectomies and advancement flap closure left ring finger  IMPLANTS: None  RADIOGRAPHIC INTERPRETATION: None  SURGICAL INDICATIONS: Patient is a right-hand-dominant gentleman who was at work and sustained the injury to his nondominant left ring finger.  Patient was seen and evaluated in the emergency department and recommended undergo the above procedure.  Risks of surgery include but not limited to bleeding infection damage nearby nerves arteries or tendons loss of motion of the wrist and digits incomplete relief of symptoms and need for further surgical intervention.  SURGICAL TECHNIQUE: Patient was palpated find the preoperative holding area marked the permanent marker made on the left ring finger and indicate the correct operative site.  Patient brought back to operating placed supine on the anesthesia table where the general anesthetic was administered.  Patient tolerated this well.  Preoperative antibiotics were given prior any skin incision.  A well-padded tourniquet was then placed on the left forearm and seal with this thousand drape.  Left upper extremities then prepped and draped in normal sterile fashion.  A timeout was called the correct site was identified the procedure then begun.  Attention was then turned to the left ring finger.  Excisional debridement of the open fracture was then carried out of the skin subcutaneous tissue and bone.  Circumferential dissection carried around the distal phalanx where the nail matrix was then removed.  The wound  was thoroughly irrigated.  This is sharp excisional debridement with sharp scissors and knife.  Once the excisional debridement was then carried out attention was then turned to the revision amputation.  Local neurectomies were then carried out.  These were done on both the radial and ulnar sides.  After the neurectomies were then carried out skin flaps were then advanced.  Advancement flap closure was done with 2 VY advancement flaps.  The wound was thoroughly irrigated.  After thorough wound irrigation the skin was then closed using simple Prolene sutures.  Adaptic dressing a sterile compressive bandage was applied.  The patient tolerated the procedure well.  5 cc quarter percent Marcaine infiltrated locally.  Patient tolerated the procedure well.  POSTOPERATIVE PLAN: Patient be discharged to home.  See him back in the office in 8 days for x-rays wound check down to see our therapist for tip protector splint.

## 2019-06-26 NOTE — Transfer of Care (Signed)
Immediate Anesthesia Transfer of Care Note  Patient: Edward Zamora  Procedure(s) Performed: left ring finger revision AMPUTATION (Left Finger) Irrigation And Debridement Left ring finger  (Left Finger)  Patient Location: PACU  Anesthesia Type:General  Level of Consciousness: drowsy, patient cooperative and responds to stimulation  Airway & Oxygen Therapy: Patient Spontanous Breathing  Post-op Assessment: Report to RN.  VSS Post vital signs: Reviewed and stable  Last Vitals:  Vitals Value Taken Time  BP    Temp 36.4 C 06/26/19 2228  Pulse 61 06/26/19 2231  Resp 16 06/26/19 2231  SpO2 96 % 06/26/19 2231  Vitals shown include unvalidated device data.  Last Pain:  Vitals:   06/26/19 1920  TempSrc:   PainSc: 9       Patients Stated Pain Goal: 0 (81/77/11 6579)  Complications: No apparent anesthesia complications

## 2019-06-26 NOTE — Anesthesia Preprocedure Evaluation (Addendum)
Anesthesia Evaluation  Patient identified by MRN, date of birth, ID band Patient awake    Reviewed: Allergy & Precautions, NPO status , Patient's Chart, lab work & pertinent test results  Airway Mallampati: II  TM Distance: >3 FB Neck ROM: Full    Dental  (+) Teeth Intact, Dental Advisory Given   Pulmonary    breath sounds clear to auscultation       Cardiovascular  Rhythm:Regular Rate:Normal     Neuro/Psych    GI/Hepatic   Endo/Other    Renal/GU      Musculoskeletal   Abdominal   Peds  Hematology   Anesthesia Other Findings   Reproductive/Obstetrics                             Anesthesia Physical Anesthesia Plan  ASA: II  Anesthesia Plan: General   Post-op Pain Management:    Induction: Intravenous  PONV Risk Score and Plan: Ondansetron and Dexamethasone  Airway Management Planned: LMA  Additional Equipment:   Intra-op Plan:   Post-operative Plan: Extubation in OR  Informed Consent: I have reviewed the patients History and Physical, chart, labs and discussed the procedure including the risks, benefits and alternatives for the proposed anesthesia with the patient or authorized representative who has indicated his/her understanding and acceptance.     Dental advisory given  Plan Discussed with: CRNA and Anesthesiologist  Anesthesia Plan Comments:         Anesthesia Quick Evaluation  

## 2019-06-26 NOTE — ED Notes (Signed)
Pt transported to short stay 

## 2019-06-26 NOTE — Anesthesia Procedure Notes (Signed)
Procedure Name: LMA Insertion Date/Time: 06/26/2019 9:48 PM Performed by: Oletta Lamas, CRNA Pre-anesthesia Checklist: Patient identified, Emergency Drugs available, Suction available and Patient being monitored Patient Re-evaluated:Patient Re-evaluated prior to induction Oxygen Delivery Method: Circle System Utilized Preoxygenation: Pre-oxygenation with 100% oxygen Induction Type: IV induction Ventilation: Mask ventilation without difficulty LMA: LMA inserted LMA Size: 5.0 Number of attempts: 1 Airway Equipment and Method: Bite block Placement Confirmation: positive ETCO2 Tube secured with: Tape Dental Injury: Teeth and Oropharynx as per pre-operative assessment

## 2019-06-26 NOTE — ED Triage Notes (Signed)
PT cut tip of Lt ring finger off the Lt ring finger with wood splitter.

## 2019-06-26 NOTE — H&P (Signed)
Edward DukesCasey W Heber is an 33 y.o. male.   Chief Complaint: Left ring finger amputation HPI: Edward Zamora is a 33 y.o. male.  Who presents emergency department chief complaint of left finger amputation.  Patient was using a wood splinter and cut off the distal end of his left ring finger.  He is right-hand dominant.  This happened about 4:30 PM.  Last tetanus vaccination about 15 years ago.  He denies any other active problems. Pt did this at work.  No past medical history on file.  Past Surgical History:  Procedure Laterality Date  . finger    . FINGER SURGERY    . IM NAILING HUMERUS    . SHOULDER SURGERY      No family history on file. Social History:  reports that he has never smoked. His smokeless tobacco use includes chew. He reports previous drug use. Drug: Marijuana. He reports that he does not drink alcohol.  Allergies:  Allergies  Allergen Reactions  . Bee Venom Swelling    Eye Area     (Not in a hospital admission)   Results for orders placed or performed during the hospital encounter of 06/26/19 (from the past 48 hour(s))  SARS Coronavirus 2 [facility](Hospital order, Performed in [facility] hospital lab) Nasopharyngeal Nasopharyngeal Swab     Status: None   Collection Time: 06/26/19  7:41 PM   Specimen: Nasopharyngeal Swab  Result Value Ref Range   SARS Coronavirus 2 NEGATIVE NEGATIVE    Comment: (NOTE) If result is NEGATIVE SARS-CoV-2 target nucleic acids are NOT DETECTED. The SARS-CoV-2 RNA is generally detectable in upper and lower  respiratory specimens during the acute phase of infection. The lowest  concentration of SARS-CoV-2 viral copies this assay can detect is 250  copies / mL. A negative result does not preclude SARS-CoV-2 infection  and should not be used as the sole basis for treatment or other  patient management decisions.  A negative result may occur with  improper specimen collection / handling, submission of specimen other  than nasopharyngeal swab, presence  of viral mutation(s) within the  areas targeted by this assay, and inadequate number of viral copies  (<250 copies / mL). A negative result must be combined with clinical  observations, patient history, and epidemiological information. If result is POSITIVE SARS-CoV-2 target nucleic acids are DETECTED. The SARS-CoV-2 RNA is generally detectable in upper and lower  respiratory specimens dur ing the acute phase of infection.  Positive  results are indicative of active infection with SARS-CoV-2.  Clinical  correlation with patient history and other diagnostic information is  necessary to determine patient infection status.  Positive results do  not rule out bacterial infection or co-infection with other viruses. If result is PRESUMPTIVE POSTIVE SARS-CoV-2 nucleic acids MAY BE PRESENT.   A presumptive positive result was obtained on the submitted specimen  and confirmed on repeat testing.  While 2019 novel coronavirus  (SARS-CoV-2) nucleic acids may be present in the submitted sample  additional confirmatory testing may be necessary for epidemiological  and / or clinical management purposes  to differentiate between  SARS-CoV-2 and other Sarbecovirus currently known to infect humans.  If clinically indicated additional testing with an alternate test  methodology [phone] is advised. The SARS-CoV-2 RNA is generally  detectable in upper and lower respiratory sp ecimens during the acute  phase of infection. The expected result is Negative. Fact Sheet for Patients:  BoilerBrush.com.cyhttps://www.fda.gov/media/136312/download Fact Sheet for Healthcare Providers: https://pope.com/https://www.fda.gov/media/136313/download This test is not yet  approved or cleared by the [place] and has been authorized for detection and/or diagnosis of SARS-CoV-2 by FDA under an Emergency Use Authorization (EUA).  This EUA will remain in effect (meaning this test can be used) for the duration of the COVID-19 declaration under Section  564(b)(1) of the Act, 21 U.S.C. section 360bbb-3(b)(1), unless the authorization is terminated or revoked sooner. Performed at [facility], [place] [address] [place], [place] [address]   CBC     Status: Abnormal   Collection Time: 06/26/19  7:45 PM  Result Value Ref Range   WBC 12.6 (H) 4.0 - 10.5 K/uL   RBC 5.10 4.22 - 5.81 MIL/uL   Hemoglobin 15.0 13.0 - 17.0 g/dL   HCT 45.7 39.0 - 52.0 %   MCV 89.6 80.0 - 100.0 fL   MCH 29.4 26.0 - 34.0 pg   MCHC 32.8 30.0 - 36.0 g/dL   RDW 12.6 11.5 - 15.5 %   Platelets 287 150 - 400 K/uL   nRBC 0.0 0.0 - 0.2 %    Comment: Performed at [facility], [place] [address] [place], [place] [address]  Basic metabolic panel     Status: Abnormal   Collection Time: 06/26/19  7:45 PM  Result Value Ref Range   Sodium 136 135 - 145 mmol/L   Potassium 4.4 3.5 - 5.1 mmol/L    Comment: HEMOLYSIS AT THIS LEVEL MAY AFFECT RESULT   Chloride 103 98 - 111 mmol/L   CO2 22 22 - 32 mmol/L   Glucose, Bld 80 70 - 99 mg/dL   BUN 24 (H) 6 - 20 mg/dL   Creatinine, Ser 1.27 (H) 0.61 - 1.24 mg/dL   Calcium 8.7 (L) 8.9 - 10.3 mg/dL   GFR calc non Af Amer >60 >60 mL/min   GFR calc Af Amer >60 >60 mL/min   Anion gap 11 5 - 15    Comment: Performed at [facility], [place] [address], [place], [place] [address]   Dg Finger Ring Left  Result Date: 06/26/2019 CLINICAL DATA:  Distal finger amputation from log splitting. EXAM: LEFT RING FINGER 2+V COMPARISON:  None. FINDINGS: Three views study shows amputation of the distal ring finger at the level of the distal phalangeal neck. No evidence for retained radiopaque soft tissue foreign body. The tip of the ring finger has also been x-rayed demonstrating the tuft of the distal phalanx. IMPRESSION: Distal amputation of the left ring finger without evidence for retained radiopaque soft tissue foreign body. Electronically Signed   By: Misty Stanley M.D.   On: 06/26/2019 19:46    ROS NO RECENT ILLNESS OR  HOSPITALIZATIONS  Blood pressure 114/84, pulse 73, temperature 98 F (36.7 C), temperature source Oral, resp. rate 15, height 5\' 5"  (1.651 m), weight 81.6 kg, SpO2 96 %. Physical Exam  General Appearance:  Alert, cooperative, no distress, appears stated age  Head:  Normocephalic, without obvious abnormality, atraumatic  Eyes:  Pupils equal, conjunctiva/corneas clear,         Throat: Lips, mucosa, and tongue normal; teeth and gums normal  Neck: No visible masses     Lungs:   respirations unlabored  Chest Wall:  No tenderness or deformity  Heart:  Regular rate and rhythm,  Abdomen:   Soft, non-tender,         Extremities: LEFT RING FINGER IN BANDAGE. NO WOUNDS TO INDEX/LONG/SMALL GOOD THUMB MOBILITY  Pulses: 2+ and symmetric  Skin: Skin color, texture, turgor normal,  no rashes or lesions     Neurologic: Normal    Assessment/Plan LEFT RING FINGER AMPUTATION THROUGH DISTAL PHALANX  LEFT RING FINGER DEBRIDEMENT AND REVISION AMPUTATION AND REPAIR AS INDICATED  R/B/A DISCUSSED WITH PT IN HOSPITAL.  PT VOICED UNDERSTANDING OF PLAN CONSENT SIGNED DAY OF SURGERY PT SEEN AND EXAMINED PRIOR TO OPERATIVE PROCEDURE/DAY OF SURGERY SITE MARKED. QUESTIONS ANSWERED WILL GO HOME FOLLOWING SURGERY  WE ARE PLANNING SURGERY FOR YOUR UPPER EXTREMITY. THE RISKS AND BENEFITS OF SURGERY INCLUDE BUT NOT LIMITED TO BLEEDING INFECTION, DAMAGE TO NEARBY NERVES ARTERIES TENDONS, FAILURE OF SURGERY TO ACCOMPLISH ITS INTENDED GOALS, PERSISTENT SYMPTOMS AND NEED FOR FURTHER SURGICAL INTERVENTION. WITH THIS IN MIND WE WILL PROCEED. I HAVE DISCUSSED WITH THE PATIENT THE PRE AND POSTOPERATIVE REGIMEN AND THE DOS AND DON'TS. PT VOICED UNDERSTANDING AND INFORMED CONSENT SIGNED.  Sharma Covert 06/26/2019, 9:33 PM

## 2019-06-26 NOTE — OR Nursing (Signed)
All belongings in book bag including cell phone to PACU prior to going to OR suite/AMasonRN

## 2019-06-26 NOTE — ED Provider Notes (Signed)
[facility] EMERGENCY DEPARTMENT Provider Note   CSN: [id number] Arrival date & time: 06/26/19  1736     History   Chief Complaint Chief Complaint  Patient presents with  . Finger Injury    HPI Edward Zamora is a 33 y.o. male.  Who presents emergency department chief complaint of left finger amputation.  Patient was using a wood splinter and cut off the distal end of his left ring finger.  He is right-hand dominant.  This happened about 4:30 PM.  Last tetanus vaccination about 15 years ago.  He denies any other active problems.     HPI  No past medical history on file.  There are no active problems to display for this patient.   Past Surgical History:  Procedure Laterality Date  . finger    . FINGER SURGERY    . IM NAILING HUMERUS    . SHOULDER SURGERY          Home Medications    Prior to Admission medications   Medication Sig Start Date End Date Taking? Authorizing Provider  cephALEXin (KEFLEX) 500 MG capsule Take 1 capsule (500 mg total) by mouth 4 (four) times daily for 10 days. 06/26/19 07/06/19  Iran Planas, MD  oxyCODONE-acetaminophen (PERCOCET) 5-325 MG tablet Take 1 tablet by mouth every 4 (four) hours as needed for up to 5 days for severe pain. 06/26/19 07/01/19  Iran Planas, MD    Family History No family history on file.  Social History Social History   Tobacco Use  . Smoking status: Never Smoker  . Smokeless tobacco: Current User    Types: Chew  Substance Use Topics  . Alcohol use: No  . Drug use: Not Currently    Types: Marijuana     Allergies   Bee venom   Review of Systems Review of Systems  Ten systems reviewed and are negative for acute change, except as noted in the HPI.   Physical Exam Updated Vital Signs BP 114/84 (BP Location: Right Arm)   Pulse 73   Temp 98 F (36.7 C) (Oral)   Resp 15   Ht 5\' 5"  (1.651 m)   Wt 81.6 kg   SpO2 96%   BMI 29.95 kg/m   Physical Exam Physical Exam  Nursing note and  vitals reviewed. Constitutional: He appears well-developed and well-nourished. No distress.  HENT:  Head: Normocephalic and atraumatic.  Eyes: Conjunctivae normal are normal. No scleral icterus.  Neck: Normal range of motion. Neck supple.  Cardiovascular: Normal rate, regular rhythm and normal heart sounds.   Pulmonary/Chest: Effort normal and breath sounds normal. No respiratory distress.  Abdominal: Soft. There is no tenderness.  Musculoskeletal: Missing the distal end of the left finger including the entire nail.  There is opened and to the finger. Neurological: He is alert.  Skin: Skin is warm and dry. He is not diaphoretic.  Psychiatric: His behavior is normal.     ED Treatments / Results  Labs (all labs ordered are listed, but only abnormal results are displayed) Labs Reviewed  CBC - Abnormal; Notable for the following components:      Result Value   WBC 12.6 (*)    All other components within normal limits  BASIC METABOLIC PANEL - Abnormal; Notable for the following components:   BUN 24 (*)    Creatinine, Ser 1.27 (*)    Calcium 8.7 (*)    All other components within normal limits  SARS CORONAVIRUS 2 (HOSPITAL ORDER, [facility]FOR[facility]  [facility])    EKG None  Radiology Dg Finger Ring Left  Result Date: 06/26/2019 CLINICAL DATA:  Distal finger amputation from log splitting. EXAM: LEFT RING FINGER 2+V COMPARISON:  None. FINDINGS: Three views study shows amputation of the distal ring finger at the level of the distal phalangeal neck. No evidence for retained radiopaque soft tissue foreign body. The tip of the ring finger has also been x-rayed demonstrating the tuft of the distal phalanx. IMPRESSION: Distal amputation of the left ring finger without evidence for retained radiopaque soft tissue foreign body. Electronically Signed   By: Kennith CenterEric  Mansell M.D.   On: 06/26/2019 19:46    Procedures .Nerve Block  Date/Time: 06/26/2019 10:20 PM Performed by: Arthor CaptainHarris,  Rozina Pointer, PA-C Authorized by: Arthor CaptainHarris, Jarrid Lienhard, PA-C   Consent:    Consent obtained:  Verbal   Consent given by:  Patient   Risks discussed:  Infection, nerve damage and unsuccessful block Indications:    Indications:  Pain relief Location:    Body area:  Upper extremity   Upper extremity nerve:  Metacarpal   Laterality:  Left Pre-procedure details:    Skin preparation:  2% chlorhexidine Procedure details (see MAR for exact dosages):    Block needle gauge:  27 G   Anesthetic injected:  Lidocaine 2% w/o epi   Injection procedure:  Anatomic landmarks identified Post-procedure details:    Outcome:  Anesthesia achieved   Patient tolerance of procedure:  Tolerated well, no immediate complications   (including critical care time)  Medications Ordered in ED Medications  ceFAZolin (ANCEF) 2-4 GM/100ML-% IVPB (has no administration in time range)  0.9 % irrigation (POUR BTL) (1,000 mLs Irrigation Given 06/26/19 2150)  bupivacaine (PF) (MARCAINE) 0.25 % injection (7 mLs Infiltration Given 06/26/19 2159)  lidocaine (XYLOCAINE) 2 % (with pres) injection 200 mg (200 mg Infiltration Given by Other 06/26/19 1920)  Tdap (BOOSTRIX) injection 0.5 mL (0.5 mLs Intramuscular Given 06/26/19 1918)  propofol (DIPRIVAN) 10 mg/mL bolus/IV push (has no administration in time range)  fentaNYL (SUBLIMAZE) 250 MCG/5ML injection (has no administration in time range)  midazolam (VERSED) 2 MG/2ML injection (has no administration in time range)  succinylcholine (ANECTINE) 200 MG/10ML syringe (has no administration in time range)  dexamethasone (DECADRON) 10 MG/ML injection (has no administration in time range)  ketorolac (TORADOL) 30 MG/ML injection (has no administration in time range)  bupivacaine (PF) (MARCAINE) 0.25 % injection (has no administration in time range)  propofol (DIPRIVAN) 10 mg/mL bolus/IV push (has no administration in time range)     Initial Impression / Assessment and Plan / ED Course  I have  reviewed the triage vital signs and the nursing notes.  Pertinent labs & imaging results that were available during my care of the patient were reviewed by me and considered in my medical decision making (see chart for details).        33 year old male here with left distal fourth finger amputation.  I have ordered Ancef, tetanus vaccination updated.  I performed a digital block and cleaned the finger.  Dr. Orlan Leavensrtman will take the patient to the OR.  I personally reviewed his labs which show some renal insufficiency or dehydration, elevated white blood cell count, negative coronavirus.  I personally reviewed the patient's left finger x-ray which shows amputation.  Final Clinical Impressions(s) / ED Diagnoses   Final diagnoses:  Traumatic amputation of finger, initial encounter    ED Discharge Orders         Ordered  Discharge patient     06/26/19 2146    oxyCODONE-acetaminophen (PERCOCET) 5-325 MG tablet  Every 4 hours PRN     06/26/19 2146    cephALEXin (KEFLEX) 500 MG capsule  4 times daily     06/26/19 2146           Arthor Captain, PA-C 06/26/19 2221    Cathren Laine, MD 06/27/19 [phone]

## 2019-06-27 ENCOUNTER — Encounter (HOSPITAL_COMMUNITY): Payer: Self-pay | Admitting: Orthopedic Surgery

## 2019-06-27 NOTE — Anesthesia Postprocedure Evaluation (Signed)
Anesthesia Post Note  Patient: Edward Zamora  Procedure(s) Performed: left ring finger revision AMPUTATION (Left Finger) Irrigation And Debridement Left ring finger  (Left Finger)     Patient location during evaluation: PACU Anesthesia Type: General Level of consciousness: awake and alert Pain management: pain level controlled Vital Signs Assessment: post-procedure vital signs reviewed and stable Respiratory status: spontaneous breathing, nonlabored ventilation, respiratory function stable and patient connected to nasal cannula oxygen Cardiovascular status: blood pressure returned to baseline and stable Postop Assessment: no apparent nausea or vomiting Anesthetic complications: no    Last Vitals:  Vitals:   06/26/19 2245 06/26/19 2300  BP: (!) 127/98 (!) 138/103  Pulse: 70 63  Resp: 20 17  Temp: (!) 36.4 C   SpO2: 98% 99%    Last Pain:  Vitals:   06/26/19 2300  TempSrc:   PainSc: 0-No pain                 Sheryl Towell COKER

## 2019-07-04 DIAGNOSIS — Z4789 Encounter for other orthopedic aftercare: Secondary | ICD-10-CM | POA: Diagnosis not present

## 2019-07-08 ENCOUNTER — Encounter (HOSPITAL_COMMUNITY): Payer: Self-pay | Admitting: Orthopedic Surgery

## 2020-01-27 ENCOUNTER — Encounter (HOSPITAL_COMMUNITY): Payer: Self-pay

## 2020-01-27 ENCOUNTER — Emergency Department (HOSPITAL_COMMUNITY): Payer: Medicare HMO

## 2020-01-27 ENCOUNTER — Emergency Department (HOSPITAL_COMMUNITY)
Admission: EM | Admit: 2020-01-27 | Discharge: 2020-01-27 | Disposition: A | Discharge status: Home / Self Care | Payer: Medicare HMO | Attending: Emergency Medicine | Admitting: Emergency Medicine

## 2020-01-27 ENCOUNTER — Other Ambulatory Visit: Payer: Self-pay

## 2020-01-27 DIAGNOSIS — M25522 Pain in left elbow: Secondary | ICD-10-CM | POA: Diagnosis not present

## 2020-01-27 DIAGNOSIS — R042 Hemoptysis: Secondary | ICD-10-CM | POA: Diagnosis not present

## 2020-01-27 DIAGNOSIS — S59902A Unspecified injury of left elbow, initial encounter: Secondary | ICD-10-CM | POA: Diagnosis not present

## 2020-01-27 DIAGNOSIS — M79645 Pain in left finger(s): Secondary | ICD-10-CM | POA: Diagnosis not present

## 2020-01-27 DIAGNOSIS — S301XXA Contusion of abdominal wall, initial encounter: Secondary | ICD-10-CM | POA: Insufficient documentation

## 2020-01-27 DIAGNOSIS — S299XXA Unspecified injury of thorax, initial encounter: Secondary | ICD-10-CM | POA: Insufficient documentation

## 2020-01-27 DIAGNOSIS — S161XXA Strain of muscle, fascia and tendon at neck level, initial encounter: Secondary | ICD-10-CM

## 2020-01-27 DIAGNOSIS — Y929 Unspecified place or not applicable: Secondary | ICD-10-CM | POA: Insufficient documentation

## 2020-01-27 DIAGNOSIS — Y9389 Activity, other specified: Secondary | ICD-10-CM | POA: Insufficient documentation

## 2020-01-27 DIAGNOSIS — R519 Headache, unspecified: Secondary | ICD-10-CM | POA: Insufficient documentation

## 2020-01-27 DIAGNOSIS — M545 Low back pain, unspecified: Secondary | ICD-10-CM

## 2020-01-27 DIAGNOSIS — W11XXXA Fall on and from ladder, initial encounter: Secondary | ICD-10-CM | POA: Diagnosis not present

## 2020-01-27 DIAGNOSIS — F17220 Nicotine dependence, chewing tobacco, uncomplicated: Secondary | ICD-10-CM | POA: Insufficient documentation

## 2020-01-27 DIAGNOSIS — M25532 Pain in left wrist: Secondary | ICD-10-CM | POA: Diagnosis not present

## 2020-01-27 DIAGNOSIS — S6992XA Unspecified injury of left wrist, hand and finger(s), initial encounter: Secondary | ICD-10-CM | POA: Diagnosis not present

## 2020-01-27 DIAGNOSIS — Y999 Unspecified external cause status: Secondary | ICD-10-CM | POA: Diagnosis not present

## 2020-01-27 DIAGNOSIS — S3991XA Unspecified injury of abdomen, initial encounter: Secondary | ICD-10-CM | POA: Diagnosis present

## 2020-01-27 LAB — COMPREHENSIVE METABOLIC PANEL
ALT: 50 U/L — ABNORMAL HIGH (ref 0–44)
AST: 27 U/L (ref 15–41)
Albumin: 4.1 g/dL (ref 3.5–5.0)
Alkaline Phosphatase: 70 U/L (ref 38–126)
Anion gap: 10 (ref 5–15)
BUN: 16 mg/dL (ref 6–20)
CO2: 23 mmol/L (ref 22–32)
Calcium: 9.1 mg/dL (ref 8.9–10.3)
Chloride: 106 mmol/L (ref 98–111)
Creatinine, Ser: 1.13 mg/dL (ref 0.61–1.24)
GFR calc Af Amer: >60 mL/min (ref >60)
GFR calc non Af Amer: >60 mL/min (ref >60)
Glucose, Bld: 91 mg/dL (ref 70–99)
Potassium: 3.7 mmol/L (ref 3.5–5.1)
Sodium: 139 mmol/L (ref 135–145)
Total Bilirubin: 0.6 mg/dL (ref 0.3–1.2)
Total Protein: 7.5 g/dL (ref 6.5–8.1)

## 2020-01-27 LAB — CBC WITH DIFFERENTIAL/PLATELET
Abs Immature Granulocytes: 0.04 10*3/uL (ref 0.00–0.07)
Basophils Absolute: 0.1 10*3/uL (ref 0.0–0.1)
Basophils Relative: 1 %
Eosinophils Absolute: 0.2 10*3/uL (ref 0.0–0.5)
Eosinophils Relative: 1 %
HCT: 50.6 % (ref 39.0–52.0)
Hemoglobin: 16.6 g/dL (ref 13.0–17.0)
Immature Granulocytes: 0 %
Lymphocytes Relative: 37 %
Lymphs Abs: 4.4 10*3/uL — ABNORMAL HIGH (ref 0.7–4.0)
MCH: 29.4 pg (ref 26.0–34.0)
MCHC: 32.8 g/dL (ref 30.0–36.0)
MCV: 89.6 fL (ref 80.0–100.0)
Monocytes Absolute: 0.7 10*3/uL (ref 0.1–1.0)
Monocytes Relative: 6 %
Neutro Abs: 6.4 10*3/uL (ref 1.7–7.7)
Neutrophils Relative %: 55 %
Platelets: 297 10*3/uL (ref 150–400)
RBC: 5.65 MIL/uL (ref 4.22–5.81)
RDW: 13 % (ref 11.5–15.5)
WBC: 11.8 10*3/uL — ABNORMAL HIGH (ref 4.0–10.5)
nRBC: 0 % (ref 0.0–0.2)

## 2020-01-27 LAB — URINALYSIS, ROUTINE W REFLEX MICROSCOPIC
Bilirubin Urine: NEGATIVE
Glucose, UA: NEGATIVE mg/dL
Hgb urine dipstick: NEGATIVE
Ketones, ur: NEGATIVE mg/dL
Leukocytes,Ua: NEGATIVE
Nitrite: NEGATIVE
Protein, ur: NEGATIVE mg/dL
Specific Gravity, Urine: 1.035 — ABNORMAL HIGH (ref 1.005–1.030)
pH: 5 (ref 5.0–8.0)

## 2020-01-27 MED ORDER — IOHEXOL 300 MG/ML  SOLN
100.0000 mL | Freq: Once | INTRAMUSCULAR | Status: AC | PRN
  Administered 2020-01-27: 09:00:00 100 mL via INTRAVENOUS

## 2020-01-27 MED ORDER — IBUPROFEN 800 MG PO TABS: 800.0000 mg | ORAL_TABLET | Freq: Three times a day (TID) | ORAL | 0 refills | Status: DC

## 2020-01-27 MED ORDER — CYCLOBENZAPRINE HCL 10 MG PO TABS: 10.0000 mg | ORAL_TABLET | Freq: Three times a day (TID) | ORAL | 0 refills | Status: DC | PRN

## 2020-01-27 MED ORDER — MORPHINE SULFATE (PF) 4 MG/ML IV SOLN
4.0000 mg | Freq: Once | INTRAVENOUS | Status: AC
  Administered 2020-01-27: 4 mg via INTRAVENOUS
  Filled 2020-01-27: qty 1

## 2020-01-27 MED ORDER — ONDANSETRON HCL 4 MG/2ML IJ SOLN
4.0000 mg | Freq: Once | INTRAMUSCULAR | Status: AC
  Administered 2020-01-27: 09:00:00 4 mg via INTRAVENOUS
  Filled 2020-01-27: qty 2

## 2020-01-27 NOTE — ED Provider Notes (Signed)
[facility] EMERGENCY DEPARTMENT Provider Note   CSN: [id number] Arrival date & time: 01/27/20  2878     History Chief Complaint  Patient presents with  . Fall    Edward Zamora is a 33 y.o. male.  HPI     Edward Zamora is a 34 y.o. male  presents to the Emergency Department with abdominal pain, back pain and left hand pain secondary to a fall from a ladder at a distance of approximately 20 feet.  Incident occurred yesterday.  He states the ladder slipped and he jumped off landing on his abdomen and chest.  He denies head injury or LOC.  He states that he thought his injuries were minimal yesterday as he was not having significant pain, but woke this morning and was told by his significant other that he was "coughing up blood" and he will also reports having increasing pain to his mid to lower back, neck, left elbow hand wrist and left ring finger, and worsening pain to his abdomen.  He denies hematuria although he states that he has not urinated this morning.  He denies headache, dizziness, visual changes, nausea or vomiting, and shortness of breath.  No pain numbness or weakness of the lower extremities.     History reviewed. No pertinent past medical history.  There are no problems to display for this patient.   Past Surgical History:  Procedure Laterality Date  . AMPUTATION Left 06/26/2019   Procedure: left ring finger revision AMPUTATION;  Surgeon: Bradly Bienenstock, MD;  Location: [facility] OR;  Service: Orthopedics;  Laterality: Left;  . finger    . FINGER SURGERY    . I & D EXTREMITY Left 06/26/2019   Procedure: Irrigation And Debridement Left ring finger ;  Surgeon: Bradly Bienenstock, MD;  Location: [facility] OR;  Service: Orthopedics;  Laterality: Left;  . IM NAILING HUMERUS    . SHOULDER SURGERY        No family history on file.  Social History   Tobacco Use  . Smoking status: Never Smoker  . Smokeless tobacco: Current User    Types: Chew  Substance Use Topics  . Alcohol use: No  .  Drug use: Not Currently    Types: Marijuana    Home Medications Prior to Admission medications   Not on File    Allergies    Bee venom  Review of Systems   Review of Systems  Constitutional: Negative for appetite change, chills and fever.  Respiratory: Negative for shortness of breath.   Cardiovascular: Negative for chest pain.  Gastrointestinal: Positive for abdominal pain, nausea and vomiting. Negative for blood in stool.  Genitourinary: Negative for decreased urine volume, difficulty urinating, dysuria and flank pain.  Musculoskeletal: Positive for arthralgias and back pain.  Skin: Negative for color change and rash.  Neurological: Negative for dizziness, weakness and numbness.  Hematological: Negative for adenopathy.    Physical Exam Updated Vital Signs Ht 5\' 5"  (1.651 m)   Wt 81.6 kg   BMI 29.95 kg/m   Physical Exam Vitals and nursing note reviewed.  Constitutional:      General: He is not in acute distress.    Appearance: Normal appearance. He is well-developed.  HENT:     Head: Normocephalic.     Mouth/Throat:     Mouth: Mucous membranes are moist.  Eyes:     Extraocular Movements: Extraocular movements intact.     Conjunctiva/sclera: Conjunctivae normal.     Pupils: Pupils are equal, round, and  reactive to light.  Neck:     Comments: Tenderness palpation of the lower cervical spine.  No bony step-offs.  Tenderness to the bilateral cervical paraspinal muscles.  Patient has full range of motion of the neck. Cardiovascular:     Rate and Rhythm: Normal rate and regular rhythm.     Pulses: Normal pulses.  Pulmonary:     Effort: Pulmonary effort is normal. No respiratory distress.     Breath sounds: Normal breath sounds.     Comments: Tenderness palpation of the bilateral anterior lateral ribs.  No bony deformities or crepitus.  No abrasions or ecchymosis on exam. Chest:     Chest wall: Tenderness present.  Abdominal:     General: Bowel sounds are normal.  There is no distension.     Palpations: Abdomen is soft. There is no mass.     Tenderness: There is abdominal tenderness. There is no guarding or rebound.     Comments: Diffuse tenderness of the entire and abdomen.  No abrasions or ecchymosis on exam.  Abdomen is soft.  Musculoskeletal:        General: Tenderness and signs of injury present. Normal range of motion.     Left elbow: No swelling or deformity. Normal range of motion. Tenderness present. No lateral epicondyle tenderness.     Cervical back: Tenderness present.     Comments: ttp of left lateral elbow, distal wrist and left ring finger.  No bony deformities, edema, ecchymosis or abrasions.  Compartments are soft.  Pain with flexion/rotation of the elbow.    Skin:    General: Skin is warm.     Capillary Refill: Capillary refill takes less than 2 seconds.     Findings: No bruising, erythema or rash.  Neurological:     Mental Status: He is alert and oriented to person, place, and time.     GCS: GCS eye subscore is 4. GCS verbal subscore is 5. GCS motor subscore is 6.     Sensory: Sensation is intact. No sensory deficit.     Motor: Motor function is intact. No weakness or abnormal muscle tone.     Coordination: Coordination is intact.     Comments: CN II-XII grossly intact.  Speech clear.       ED Results / Procedures / Treatments   Labs (all labs ordered are listed, but only abnormal results are displayed) Labs Reviewed  COMPREHENSIVE METABOLIC PANEL - Abnormal; Notable for the following components:      Result Value   ALT 50 (*)    All other components within normal limits  CBC WITH DIFFERENTIAL/PLATELET - Abnormal; Notable for the following components:   WBC 11.8 (*)    Lymphs Abs 4.4 (*)    All other components within normal limits  URINALYSIS, ROUTINE W REFLEX MICROSCOPIC - Abnormal; Notable for the following components:   Specific Gravity, Urine 1.035 (*)    All other components within normal limits     EKG None  Radiology DG Elbow Complete Left  Result Date: 01/27/2020 CLINICAL DATA:  Fall EXAM: LEFT ELBOW - COMPLETE 3+ VIEW COMPARISON:  None. FINDINGS: There is no evidence of fracture, dislocation, or joint effusion. There is no evidence of arthropathy or other focal bone abnormality. Partial visualization of intramedullary rod in the distal humerus. Soft tissues are unremarkable. IMPRESSION: No acute osseous abnormality. Electronically Signed   By: Jonna Clark M.D.   On: 01/27/2020 08:39   DG Wrist Complete Left  Result Date: 01/27/2020 CLINICAL  DATA:  Fall EXAM: LEFT WRIST - COMPLETE 3+ VIEW COMPARISON:  None. FINDINGS: There is no evidence of fracture or dislocation. There is no evidence of arthropathy or other focal bone abnormality. Soft tissues are unremarkable. IMPRESSION: No acute osseous abnormality. Electronically Signed   By: Jonna Clark M.D.   On: 01/27/2020 08:39   CT Head Wo Contrast  Result Date: 01/27/2020 CLINICAL DATA:  Fall from ladder, headache EXAM: CT HEAD WITHOUT CONTRAST CT CERVICAL SPINE WITHOUT CONTRAST TECHNIQUE: Multidetector CT imaging of the head and cervical spine was performed following the standard protocol without intravenous contrast. Multiplanar CT image reconstructions of the cervical spine were also generated. COMPARISON:  None. FINDINGS: CT HEAD FINDINGS Brain: No evidence of acute infarction, hemorrhage, hydrocephalus, extra-axial collection or mass lesion/mass effect. Vascular: No hyperdense vessel or unexpected calcification. Skull: Normal. Negative for fracture or focal lesion. Sinuses/Orbits: No acute finding. Other: None. CT CERVICAL SPINE FINDINGS Alignment: Normal. Skull base and vertebrae: No acute fracture. No primary bone lesion or focal pathologic process. Soft tissues and spinal canal: No prevertebral fluid or swelling. No visible canal hematoma. Disc levels: Focally mild disc space height loss and osteophytosis of C5-C6. Upper chest:  Negative. Other: None. IMPRESSION: 1.  No acute intracranial pathology. 2.  No fracture or static subluxation of the cervical spine. Electronically Signed   By: Lauralyn Primes M.D.   On: 01/27/2020 09:56   CT CHEST W CONTRAST  Result Date: 01/27/2020 CLINICAL DATA:  Fall from ladder yesterday, hemoptysis, back pain, abdominal pain EXAM: CT CHEST, ABDOMEN, AND PELVIS WITH CONTRAST TECHNIQUE: Multidetector CT imaging of the chest, abdomen and pelvis was performed following the standard protocol during bolus administration of intravenous contrast. CONTRAST:  OMNIPAQUE IOHEXOL 300 MG/ML  SOLN COMPARISON:  None. FINDINGS: CT CHEST FINDINGS Cardiovascular: No significant vascular findings. Normal heart size. No pericardial effusion. Mediastinum/Nodes: No enlarged mediastinal, hilar, or axillary lymph nodes. Age-appropriate thymic remnant in the anterior mediastinum. Thyroid gland, trachea, and esophagus demonstrate no significant findings. Lungs/Pleura: Lungs are clear. No pleural effusion or pneumothorax. Musculoskeletal: No chest wall mass or suspicious bone lesions identified. Partially imaged rod fixation of the left humerus. CT ABDOMEN PELVIS FINDINGS Hepatobiliary: No solid liver abnormality is seen. Hepatic steatosis. No gallstones, gallbladder wall thickening, or biliary dilatation. Pancreas: Unremarkable. No pancreatic ductal dilatation or surrounding inflammatory changes. Spleen: Normal in size without significant abnormality. Adrenals/Urinary Tract: Adrenal glands are unremarkable. Kidneys are normal, without renal calculi, solid lesion, or hydronephrosis. Bladder is unremarkable. Stomach/Bowel: Stomach is within normal limits. Appendix appears normal. No evidence of bowel wall thickening, distention, or inflammatory changes. Vascular/Lymphatic: No significant vascular findings are present. No enlarged abdominal or pelvic lymph nodes. Reproductive: No mass or other abnormality. Other: No abdominal wall  hernia or abnormality. No abdominopelvic ascites. Musculoskeletal: Corticated, nonacute appearing corner type fracture or limbus ossification variant of the anterior superior endplate of L4 (series 6, image 92). IMPRESSION: 1. No CT evidence of acute traumatic injury to the chest, abdomen, or pelvis. 2. Corticated, nonacute appearing corner type fracture or limbus ossification variant of the anterior superior endplate of the L4 vertebral body. 3.  Hepatic steatosis. Electronically Signed   By: Lauralyn Primes M.D.   On: 01/27/2020 09:50   CT Cervical Spine Wo Contrast  Result Date: 01/27/2020 CLINICAL DATA:  Fall from ladder, headache EXAM: CT HEAD WITHOUT CONTRAST CT CERVICAL SPINE WITHOUT CONTRAST TECHNIQUE: Multidetector CT imaging of the head and cervical spine was performed following the standard protocol without intravenous  contrast. Multiplanar CT image reconstructions of the cervical spine were also generated. COMPARISON:  None. FINDINGS: CT HEAD FINDINGS Brain: No evidence of acute infarction, hemorrhage, hydrocephalus, extra-axial collection or mass lesion/mass effect. Vascular: No hyperdense vessel or unexpected calcification. Skull: Normal. Negative for fracture or focal lesion. Sinuses/Orbits: No acute finding. Other: None. CT CERVICAL SPINE FINDINGS Alignment: Normal. Skull base and vertebrae: No acute fracture. No primary bone lesion or focal pathologic process. Soft tissues and spinal canal: No prevertebral fluid or swelling. No visible canal hematoma. Disc levels: Focally mild disc space height loss and osteophytosis of C5-C6. Upper chest: Negative. Other: None. IMPRESSION: 1.  No acute intracranial pathology. 2.  No fracture or static subluxation of the cervical spine. Electronically Signed   By: Lauralyn PrimesAlex  Bibbey M.D.   On: 01/27/2020 09:56   CT ABDOMEN PELVIS W CONTRAST  Result Date: 01/27/2020 CLINICAL DATA:  Fall from ladder yesterday, hemoptysis, back pain, abdominal pain EXAM: CT CHEST,  ABDOMEN, AND PELVIS WITH CONTRAST TECHNIQUE: Multidetector CT imaging of the chest, abdomen and pelvis was performed following the standard protocol during bolus administration of intravenous contrast. CONTRAST:  100mL OMNIPAQUE IOHEXOL 300 MG/ML  SOLN COMPARISON:  None. FINDINGS: CT CHEST FINDINGS Cardiovascular: No significant vascular findings. Normal heart size. No pericardial effusion. Mediastinum/Nodes: No enlarged mediastinal, hilar, or axillary lymph nodes. Age-appropriate thymic remnant in the anterior mediastinum. Thyroid gland, trachea, and esophagus demonstrate no significant findings. Lungs/Pleura: Lungs are clear. No pleural effusion or pneumothorax. Musculoskeletal: No chest wall mass or suspicious bone lesions identified. Partially imaged rod fixation of the left humerus. CT ABDOMEN PELVIS FINDINGS Hepatobiliary: No solid liver abnormality is seen. Hepatic steatosis. No gallstones, gallbladder wall thickening, or biliary dilatation. Pancreas: Unremarkable. No pancreatic ductal dilatation or surrounding inflammatory changes. Spleen: Normal in size without significant abnormality. Adrenals/Urinary Tract: Adrenal glands are unremarkable. Kidneys are normal, without renal calculi, solid lesion, or hydronephrosis. Bladder is unremarkable. Stomach/Bowel: Stomach is within normal limits. Appendix appears normal. No evidence of bowel wall thickening, distention, or inflammatory changes. Vascular/Lymphatic: No significant vascular findings are present. No enlarged abdominal or pelvic lymph nodes. Reproductive: No mass or other abnormality. Other: No abdominal wall hernia or abnormality. No abdominopelvic ascites. Musculoskeletal: Corticated, nonacute appearing corner type fracture or limbus ossification variant of the anterior superior endplate of L4 (series 6, image 92). IMPRESSION: 1. No CT evidence of acute traumatic injury to the chest, abdomen, or pelvis. 2. Corticated, nonacute appearing corner type  fracture or limbus ossification variant of the anterior superior endplate of the L4 vertebral body. 3.  Hepatic steatosis. Electronically Signed   By: Lauralyn PrimesAlex  Bibbey M.D.   On: 01/27/2020 09:50   DG Hand Complete Left  Result Date: 01/27/2020 CLINICAL DATA:  Fall EXAM: LEFT HAND - COMPLETE 3+ VIEW COMPARISON:  June 26, 2019 FINDINGS: There is no evidence of fracture or dislocation. 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 Soft tissues are unremarkable. IMPRESSION: No acute osseous abnormality. Electronically Signed   By: Jonna ClarkBindu  Avutu M.D.   On: 01/27/2020 08:41    Procedures Procedures (including critical care time)  Medications Ordered in ED Medications - No data to display  ED Course  I have reviewed the triage vital signs and the nursing notes.  Pertinent labs & imaging results that were available during my care of the patient were reviewed by me and considered in my medical decision making (see chart for details).    MDM Rules/Calculators/A&P  Pt with reported fall 20 ft from a ladder.  Trauma CT's of the head , C spine, chest and abdomen/pelvis along with plain films of the elbow, wrist and hand were ordered and all were without acute injury.  He has not displayed a cough or hemoptysis during ER stay.  His vitals and laboratory studies have remained stable and reassuring.  He is ambulatory in the dept with a steady gait and neurological exam is without deficits.  His injuries are likely musculoskeletal and he appears appropriate for d/c home with symptomatic tx and I have given strict return precautions.  Final Clinical Impression(s) / ED Diagnoses Final diagnoses:  Fall from ladder, initial encounter  Contusion of abdominal wall, initial encounter  Acute midline low back pain without sciatica  Strain of neck muscle, initial encounter    Rx / DC Orders ED Discharge Orders    None       Kem Parkinson,  PA-C 01/28/20 [id number]    Maudie Flakes, MD 01/29/20 [phone]

## 2020-01-27 NOTE — Discharge Instructions (Addendum)
The CTs of your head neck back abdomen and chest did not show evidence of bony or organ injury.  X-ray of your wrist hand and elbow did not show evidence for fractures or dislocations.  You likely have contusions related to your injury.  You may apply ice packs on and off to the affected areas as needed.  Follow-up with your primary doctor for recheck.  Avoid strenuous activity for at least 1 week.  Return emergency department if you develop any worsening symptoms.

## 2020-01-27 NOTE — ED Triage Notes (Signed)
Pt fell 20 feet off a ladder yesterday and landed on his abdomen. This morning, he began coughing up blood. He is complaining of back pain, abdominal pain, and left hand pain. Pt was not hurting yesterday, therefore he did not come in.

## 2020-03-28 ENCOUNTER — Emergency Department (HOSPITAL_COMMUNITY)
Admission: EM | Admit: 2020-03-28 | Discharge: 2020-03-28 | Disposition: A | Discharge status: Home / Self Care | Payer: Medicare HMO | Attending: Emergency Medicine | Admitting: Emergency Medicine

## 2020-03-28 ENCOUNTER — Other Ambulatory Visit: Payer: Self-pay

## 2020-03-28 ENCOUNTER — Emergency Department (HOSPITAL_COMMUNITY): Payer: Medicare HMO

## 2020-03-28 ENCOUNTER — Encounter (HOSPITAL_COMMUNITY): Payer: Self-pay | Admitting: Emergency Medicine

## 2020-03-28 DIAGNOSIS — M79661 Pain in right lower leg: Secondary | ICD-10-CM | POA: Diagnosis not present

## 2020-03-28 DIAGNOSIS — Z880 Allergy status to penicillin: Secondary | ICD-10-CM | POA: Diagnosis not present

## 2020-03-28 DIAGNOSIS — Y999 Unspecified external cause status: Secondary | ICD-10-CM | POA: Insufficient documentation

## 2020-03-28 DIAGNOSIS — R0789 Other chest pain: Secondary | ICD-10-CM | POA: Insufficient documentation

## 2020-03-28 DIAGNOSIS — Y92410 Unspecified street and highway as the place of occurrence of the external cause: Secondary | ICD-10-CM | POA: Diagnosis not present

## 2020-03-28 DIAGNOSIS — M79662 Pain in left lower leg: Secondary | ICD-10-CM | POA: Diagnosis not present

## 2020-03-28 DIAGNOSIS — Y9389 Activity, other specified: Secondary | ICD-10-CM | POA: Diagnosis not present

## 2020-03-28 DIAGNOSIS — F17220 Nicotine dependence, chewing tobacco, uncomplicated: Secondary | ICD-10-CM | POA: Diagnosis not present

## 2020-03-28 DIAGNOSIS — S1081XA Abrasion of other specified part of neck, initial encounter: Secondary | ICD-10-CM | POA: Insufficient documentation

## 2020-03-28 DIAGNOSIS — S1091XA Abrasion of unspecified part of neck, initial encounter: Secondary | ICD-10-CM

## 2020-03-28 LAB — BASIC METABOLIC PANEL
Anion gap: 9 (ref 5–15)
BUN: 19 mg/dL (ref 6–20)
CO2: 23 mmol/L (ref 22–32)
Calcium: 8.3 mg/dL — ABNORMAL LOW (ref 8.9–10.3)
Chloride: 106 mmol/L (ref 98–111)
Creatinine, Ser: 1.15 mg/dL (ref 0.61–1.24)
GFR calc Af Amer: >60 mL/min (ref >60)
GFR calc non Af Amer: >60 mL/min (ref >60)
Glucose, Bld: 165 mg/dL — ABNORMAL HIGH (ref 70–99)
Potassium: 3.8 mmol/L (ref 3.5–5.1)
Sodium: 138 mmol/L (ref 135–145)

## 2020-03-28 LAB — CBC WITH DIFFERENTIAL/PLATELET
Abs Immature Granulocytes: 0.02 10*3/uL (ref 0.00–0.07)
Basophils Absolute: 0.1 10*3/uL (ref 0.0–0.1)
Basophils Relative: 1 %
Eosinophils Absolute: 0.1 10*3/uL (ref 0.0–0.5)
Eosinophils Relative: 2 %
HCT: 47.5 % (ref 39.0–52.0)
Hemoglobin: 15.4 g/dL (ref 13.0–17.0)
Immature Granulocytes: 0 %
Lymphocytes Relative: 35 %
Lymphs Abs: 2.5 10*3/uL (ref 0.7–4.0)
MCH: 29.1 pg (ref 26.0–34.0)
MCHC: 32.4 g/dL (ref 30.0–36.0)
MCV: 89.6 fL (ref 80.0–100.0)
Monocytes Absolute: 0.4 10*3/uL (ref 0.1–1.0)
Monocytes Relative: 5 %
Neutro Abs: 4.1 10*3/uL (ref 1.7–7.7)
Neutrophils Relative %: 57 %
Platelets: 232 10*3/uL (ref 150–400)
RBC: 5.3 MIL/uL (ref 4.22–5.81)
RDW: 12.4 % (ref 11.5–15.5)
WBC: 7.2 10*3/uL (ref 4.0–10.5)
nRBC: 0 % (ref 0.0–0.2)

## 2020-03-28 MED ORDER — DICLOFENAC SODIUM 1 % EX GEL: 2.0000 g | Freq: Four times a day (QID) | CUTANEOUS | 0 refills | Status: DC | PRN

## 2020-03-28 MED ORDER — METHOCARBAMOL 500 MG PO TABS
500.0000 mg | ORAL_TABLET | Freq: Three times a day (TID) | ORAL | 0 refills | Status: DC | PRN
Start: 2020-03-28 — End: 2024-03-28

## 2020-03-28 MED ORDER — IOHEXOL 350 MG/ML SOLN
100.0000 mL | Freq: Once | INTRAVENOUS | Status: AC | PRN
  Administered 2020-03-28: 100 mL via INTRAVENOUS

## 2020-03-28 MED ORDER — IBUPROFEN 800 MG PO TABS: 800.0000 mg | ORAL_TABLET | Freq: Three times a day (TID) | ORAL | 0 refills | Status: DC | PRN

## 2020-03-28 NOTE — Discharge Instructions (Signed)
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
## 2020-03-28 NOTE — ED Notes (Signed)
Pt transported to xray 

## 2020-06-06 ENCOUNTER — Encounter (HOSPITAL_COMMUNITY): Payer: Self-pay | Admitting: Emergency Medicine

## 2020-06-06 ENCOUNTER — Other Ambulatory Visit: Payer: Self-pay

## 2020-06-06 ENCOUNTER — Emergency Department (HOSPITAL_COMMUNITY)
Admission: EM | Admit: 2020-06-06 | Discharge: 2020-06-06 | Disposition: A | Discharge status: Home / Self Care | Payer: Medicare HMO | Attending: Emergency Medicine | Admitting: Emergency Medicine

## 2020-06-06 DIAGNOSIS — M79602 Pain in left arm: Secondary | ICD-10-CM | POA: Diagnosis not present

## 2020-06-06 DIAGNOSIS — M79601 Pain in right arm: Secondary | ICD-10-CM | POA: Diagnosis not present

## 2020-06-06 DIAGNOSIS — R21 Rash and other nonspecific skin eruption: Secondary | ICD-10-CM | POA: Diagnosis not present

## 2020-06-06 DIAGNOSIS — Z5321 Procedure and treatment not carried out due to patient leaving prior to being seen by health care provider: Secondary | ICD-10-CM | POA: Diagnosis not present

## 2020-06-06 NOTE — ED Triage Notes (Signed)
Pt picked up a log yesterday and woke up this morning with a rash to both his arms. Itching, burning and pain to arms

## 2020-08-22 ENCOUNTER — Emergency Department (HOSPITAL_COMMUNITY)
Admission: EM | Admit: 2020-08-22 | Discharge: 2020-08-22 | Disposition: A | Discharge status: Home / Self Care | Payer: Medicare HMO | Attending: Emergency Medicine | Admitting: Emergency Medicine

## 2020-08-22 ENCOUNTER — Encounter (HOSPITAL_COMMUNITY): Payer: Self-pay | Admitting: Emergency Medicine

## 2020-08-22 ENCOUNTER — Other Ambulatory Visit: Payer: Self-pay

## 2020-08-22 DIAGNOSIS — R531 Weakness: Secondary | ICD-10-CM

## 2020-08-22 DIAGNOSIS — R55 Syncope and collapse: Secondary | ICD-10-CM | POA: Insufficient documentation

## 2020-08-22 LAB — CBC WITH DIFFERENTIAL/PLATELET
Basophils Absolute: 0 10*3/uL (ref 0.0–0.1)
Basophils Relative: 0 %
Eosinophils Absolute: 0 10*3/uL (ref 0.0–0.5)
Eosinophils Relative: 0 %
HCT: 45.8 % (ref 39.0–52.0)
Hemoglobin: 14.9 g/dL (ref 13.0–17.0)
Lymphocytes Relative: 21 %
Lymphs Abs: 2.8 10*3/uL (ref 0.7–4.0)
MCH: 29.2 pg (ref 26.0–34.0)
MCHC: 32.5 g/dL (ref 30.0–36.0)
MCV: 89.8 fL (ref 80.0–100.0)
Monocytes Absolute: 0.5 10*3/uL (ref 0.1–1.0)
Monocytes Relative: 4 %
Neutro Abs: 10 10*3/uL — ABNORMAL HIGH (ref 1.7–7.7)
Neutrophils Relative %: 75 %
Platelets: 200 10*3/uL (ref 150–400)
RBC: 5.1 MIL/uL (ref 4.22–5.81)
RDW: 12.5 % (ref 11.5–15.5)
WBC: 13.3 10*3/uL — ABNORMAL HIGH (ref 4.0–10.5)
nRBC: 0 % (ref 0.0–0.2)

## 2020-08-22 LAB — BASIC METABOLIC PANEL
Anion gap: 10 (ref 5–15)
BUN: 24 mg/dL — ABNORMAL HIGH (ref 6–20)
CO2: 23 mmol/L (ref 22–32)
Calcium: 8.4 mg/dL — ABNORMAL LOW (ref 8.9–10.3)
Chloride: 106 mmol/L (ref 98–111)
Creatinine, Ser: 1.19 mg/dL (ref 0.61–1.24)
GFR, Estimated: >60 mL/min (ref >60)
Glucose, Bld: 82 mg/dL (ref 70–99)
Potassium: 3.5 mmol/L (ref 3.5–5.1)
Sodium: 139 mmol/L (ref 135–145)

## 2020-08-22 MED ORDER — SODIUM CHLORIDE 0.9 % IV BOLUS
1000.0000 mL | Freq: Once | INTRAVENOUS | Status: AC
  Administered 2020-08-22: 1000 mL via INTRAVENOUS

## 2020-08-22 NOTE — ED Triage Notes (Signed)
Pt brought in via [facility]. Pt reports walking today and becoming weak, passing out, and falling. Pt reports his neck and right leg hurt from the fall. Pt A&O X 4.

## 2020-08-22 NOTE — Discharge Instructions (Addendum)
Drink plenty of fluids and get plenty of rest.  Follow-up with your primary doctor in the next week, and return to the ER if symptoms significantly worsen or change.

## 2020-08-22 NOTE — ED Notes (Signed)
Pt reports "hanging with my buddy" on [address] Last night "occasionally take a pill"  Last night percocet- non prescribed  Left last night walking home where he lives with his parents   Reports passed out an hour ago   Here by EMS  Pt is dirty and odoriferious poor dental care   He is conversant and in NAD
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
## 2021-06-30 ENCOUNTER — Other Ambulatory Visit: Payer: Self-pay

## 2021-06-30 ENCOUNTER — Encounter (HOSPITAL_COMMUNITY): Payer: Self-pay

## 2021-06-30 ENCOUNTER — Emergency Department (HOSPITAL_COMMUNITY)
Admission: EM | Admit: 2021-06-30 | Discharge: 2021-06-30 | Disposition: A | Discharge status: Home / Self Care | Payer: Medicare HMO | Attending: Emergency Medicine | Admitting: Emergency Medicine

## 2021-06-30 DIAGNOSIS — Z4801 Encounter for change or removal of surgical wound dressing: Secondary | ICD-10-CM | POA: Diagnosis not present

## 2021-06-30 DIAGNOSIS — Z5321 Procedure and treatment not carried out due to patient leaving prior to being seen by health care provider: Secondary | ICD-10-CM | POA: Insufficient documentation

## 2021-06-30 NOTE — ED Triage Notes (Signed)
Pt to er, pt states that for the past three weeks he has had a rash/ wound on his lower back, states that the area seems to be getting worse, pt has open wound on lower back, pt states that he was seen at another hospital and old to come back in three weeks if it wasn't better.

## 2021-07-11 ENCOUNTER — Encounter: Payer: Self-pay | Admitting: Emergency Medicine

## 2021-07-11 ENCOUNTER — Other Ambulatory Visit: Payer: Self-pay

## 2021-07-11 ENCOUNTER — Ambulatory Visit
Admission: EM | Admit: 2021-07-11 | Discharge: 2021-07-11 | Disposition: A | Discharge status: Home / Self Care | Payer: Medicare HMO | Attending: Family Medicine | Admitting: Family Medicine

## 2021-07-11 DIAGNOSIS — L02219 Cutaneous abscess of trunk, unspecified: Secondary | ICD-10-CM | POA: Diagnosis not present

## 2021-07-11 DIAGNOSIS — L03319 Cellulitis of trunk, unspecified: Secondary | ICD-10-CM | POA: Diagnosis not present

## 2021-07-11 MED ORDER — DOXYCYCLINE HYCLATE 100 MG PO CAPS
100.0000 mg | ORAL_CAPSULE | Freq: Two times a day (BID) | ORAL | 0 refills | Status: DC
Start: 2021-07-11 — End: 2024-03-28

## 2021-07-11 MED ORDER — NAPROXEN 375 MG PO TABS
375.0000 mg | ORAL_TABLET | Freq: Two times a day (BID) | ORAL | 0 refills | Status: DC
Start: 2021-07-11 — End: 2024-03-28

## 2021-07-11 NOTE — ED Provider Notes (Signed)
[facility]    CSN: [id number] Arrival date & time: 07/11/21  1138      History   Chief Complaint No chief complaint on file.   HPI Edward Zamora is a 35 y.o. male.   HPI  History reviewed. No pertinent past medical history.  There are no problems to display for this patient.   Past Surgical History:  Procedure Laterality Date   AMPUTATION Left 06/26/2019   Procedure: left ring finger revision AMPUTATION;  Surgeon: Bradly Bienenstock, MD;  Location: [facility] OR;  Service: Orthopedics;  Laterality: Left;   finger     FINGER SURGERY     I & D EXTREMITY Left 06/26/2019   Procedure: Irrigation And Debridement Left ring finger ;  Surgeon: Bradly Bienenstock, MD;  Location: [facility] OR;  Service: Orthopedics;  Laterality: Left;   IM NAILING HUMERUS     SHOULDER SURGERY         Home Medications    Prior to Admission medications   Medication Sig Start Date End Date Taking? Authorizing Provider  doxycycline (VIBRAMYCIN) 100 MG capsule Take 1 capsule (100 mg total) by mouth 2 (two) times daily. 07/11/21  Yes Bing Neighbors, FNP  naproxen (NAPROSYN) 375 MG tablet Take 1 tablet (375 mg total) by mouth 2 (two) times daily. 07/11/21  Yes Bing Neighbors, FNP  cyclobenzaprine (FLEXERIL) 10 MG tablet Take 1 tablet (10 mg total) by mouth 3 (three) times daily as needed. Patient not taking: Reported on 03/28/2020 01/27/20   Triplett, Tammy, PA-C  diclofenac Sodium (VOLTAREN) 1 % GEL Apply 2 g topically 4 (four) times daily as needed. 03/28/20   Long, Arlyss Repress, MD  gabapentin (NEURONTIN) 100 MG capsule Take 100 mg by mouth 3 (three) times daily. 01/08/20   [provider]  ibuprofen (ADVIL) 800 MG tablet Take 1 tablet (800 mg total) by mouth every 8 (eight) hours as needed for moderate pain. 03/28/20   Long, Arlyss Repress, MD  methocarbamol (ROBAXIN) 500 MG tablet Take 1 tablet (500 mg total) by mouth every 8 (eight) hours as needed for muscle spasms. 03/28/20   Long, Arlyss Repress, MD    Family  History Family History  Problem Relation Age of Onset   Healthy Mother    COPD Father     Social History Social History   Tobacco Use   Smoking status: Never   Smokeless tobacco: Current    Types: Chew  Vaping Use   Vaping Use: Never used  Substance Use Topics   Alcohol use: No   Drug use: Not Currently    Types: Marijuana    Comment: occais pain pill per pt     Allergies   Penicillins and Bee venom   Review of Systems Review of Systems Pertinent negatives listed in HPI   Physical Exam Triage Vital Signs ED Triage Vitals [07/11/21 1230]  Enc Vitals Group     BP 125/87     Pulse Rate 88     Resp 18     Temp 97.9 F (36.6 C)     Temp Source Temporal     SpO2 96 %     Weight      Height      Head Circumference      Peak Flow      Pain Score 10     Pain Loc      Pain Edu?      Excl. in GC?    No data found.  Updated Vital Signs BP 125/87 (BP Location: Right Arm)   Pulse 88   Temp 97.9 F (36.6 C) (Temporal)   Resp 18   SpO2 96%   Visual Acuity Right Eye Distance:   Left Eye Distance:   Bilateral Distance:    Right Eye Near:   Left Eye Near:    Bilateral Near:     Physical Exam General appearance: alert, well developed, well nourished, cooperative and in no distress Head: Normocephalic, without obvious abnormality, atraumatic Respiratory: Respirations even and unlabored, normal respiratory rate Heart:Rate and rhythm normal. No gallop or murmurs noted on exam Skin: Abscess with surrounding erythema and warmth present   Psych: Appropriate mood and affect. Neurologic:  No acute focal abnormalities  UC Treatments / Results  Labs (all labs ordered are listed, but only abnormal results are displayed) Labs Reviewed - No data to display  EKG   Radiology No results found.  Procedures Incision and Drainage  Date/Time: 07/11/2021 1:29 PM Performed by: Bing Neighbors, FNP Authorized by: Bing Neighbors, FNP   Consent:    Consent  obtained:  Verbal   Consent given by:  Patient   Risks discussed:  Incomplete drainage   Alternatives discussed:  No treatment Universal protocol:    Patient identity confirmed:  Verbally with patient Location:    Type:  Abscess   Location:  Trunk Pre-procedure details:    Skin preparation:  Antiseptic wash Anesthesia:    Anesthesia method:  Topical application and local infiltration   Topical anesthetic:  LET   Local anesthetic:  Lidocaine 2% WITH epi Procedure details:    Incision types:  Stab incision   Drainage:  Serosanguinous   Drainage amount:  Moderate   Wound treatment:  Wound left open Post-procedure details:    Procedure completion:  Tolerated (including critical care time)  Medications Ordered in UC Medications - No data to display  Initial Impression / Assessment and Plan / UC Course  I have reviewed the triage vital signs and the nursing notes.  Pertinent labs & imaging results that were available during my care of the patient were reviewed by me and considered in my medical decision making (see chart for details).    Cellulitis with abscess of trunk  Tolerated I&D  Wound care instructions discussed Treatment per discharge medication orders  Final Clinical Impressions(s) / UC Diagnoses   Final diagnoses:  Cellulitis and abscess of trunk   Discharge Instructions   None    ED Prescriptions     Medication Sig Dispense Auth. Provider   doxycycline (VIBRAMYCIN) 100 MG capsule Take 1 capsule (100 mg total) by mouth 2 (two) times daily. 20 capsule Bing Neighbors, FNP   naproxen (NAPROSYN) 375 MG tablet Take 1 tablet (375 mg total) by mouth 2 (two) times daily. 20 tablet Bing Neighbors, FNP      PDMP not reviewed this encounter.   Bing Neighbors, FNP 07/15/21 [phone]

## 2021-07-11 NOTE — ED Triage Notes (Signed)
Abscess on lower back x 1 week.

## 2021-08-06 ENCOUNTER — Encounter (HOSPITAL_COMMUNITY): Payer: Self-pay | Admitting: *Deleted

## 2021-08-06 ENCOUNTER — Emergency Department (HOSPITAL_COMMUNITY): Payer: Medicare HMO

## 2021-08-06 ENCOUNTER — Emergency Department (HOSPITAL_COMMUNITY)
Admission: EM | Admit: 2021-08-06 | Discharge: 2021-08-06 | Disposition: A | Discharge status: Home / Self Care | Payer: Medicare HMO | Attending: Emergency Medicine | Admitting: Emergency Medicine

## 2021-08-06 DIAGNOSIS — Y30XXXA Falling, jumping or pushed from a high place, undetermined intent, initial encounter: Secondary | ICD-10-CM | POA: Insufficient documentation

## 2021-08-06 DIAGNOSIS — S6991XA Unspecified injury of right wrist, hand and finger(s), initial encounter: Secondary | ICD-10-CM | POA: Diagnosis present

## 2021-08-06 DIAGNOSIS — F17220 Nicotine dependence, chewing tobacco, uncomplicated: Secondary | ICD-10-CM | POA: Insufficient documentation

## 2021-08-06 DIAGNOSIS — S63501A Unspecified sprain of right wrist, initial encounter: Secondary | ICD-10-CM | POA: Diagnosis not present

## 2021-08-06 DIAGNOSIS — S300XXA Contusion of lower back and pelvis, initial encounter: Secondary | ICD-10-CM | POA: Insufficient documentation

## 2021-08-06 DIAGNOSIS — S30810A Abrasion of lower back and pelvis, initial encounter: Secondary | ICD-10-CM

## 2021-08-06 DIAGNOSIS — Y92009 Unspecified place in unspecified non-institutional (private) residence as the place of occurrence of the external cause: Secondary | ICD-10-CM | POA: Insufficient documentation

## 2021-08-06 MED ORDER — ACETAMINOPHEN 325 MG PO TABS
650.0000 mg | ORAL_TABLET | Freq: Once | ORAL | Status: AC
  Administered 2021-08-06: 650 mg via ORAL
  Filled 2021-08-06: qty 2

## 2021-08-06 NOTE — ED Triage Notes (Signed)
Jumped out of a window at home, pain in right wrist and back

## 2021-08-06 NOTE — Discharge Instructions (Addendum)
The x-rays of your lower back and wrist today were negative for any broken bones or dislocations.  You likely have a sprain of your wrist.  The numbness of your fingers should improve over the next several days.  I recommend that you follow-up with orthopedics next week for recheck.  Take Tylenol 500 mg every 4-6 hours if needed for pain.  Or you may also take ibuprofen 800 mg 3 times a day with food.  You may follow-up with the orthopedic provider listed or an orthopedic provider that is recommended by the facility.

## 2021-08-07 NOTE — ED Provider Notes (Signed)
[facility] EMERGENCY DEPARTMENT Provider Note   CSN: [id number] Arrival date & time: 08/06/21  1211     History Chief Complaint  Patient presents with   Wrist Pain   Back Pain    Edward Zamora is a 35 y.o. male.   Wrist Pain Pertinent negatives include no chest pain, no abdominal pain, no headaches and no shortness of breath.  Back Pain Associated symptoms: no abdominal pain, no chest pain, no dysuria, no fever, no headaches, no numbness and no weakness       Edward Zamora is a 35 y.o. male who presents to the Emergency Department complaining of right wrist and low back pain secondary to injury.  Patient states that he jumped approximately 12 feet out of a window trying to run from police. He was able to get up and run, but states the police tackled him to the ground and he believes this is what caused his wrist and back pain.  He describes aching pain to mid low back that is non radiating.  He denies abdominal pain, urine or bowel changes, pain, numbness or weakness to his lower extremities.  Right wrist pain that worsens with movement.  He describes numbness and tingling to his right middle, ring and pinky fingers as well.  Denies swelling, forearm or elbow pain.     History reviewed. No pertinent past medical history.  There are no problems to display for this patient.   Past Surgical History:  Procedure Laterality Date   AMPUTATION Left 06/26/2019   Procedure: left ring finger revision AMPUTATION;  Surgeon: Bradly Bienenstock, MD;  Location: [facility] OR;  Service: Orthopedics;  Laterality: Left;   finger     FINGER SURGERY     I & D EXTREMITY Left 06/26/2019   Procedure: Irrigation And Debridement Left ring finger ;  Surgeon: Bradly Bienenstock, MD;  Location: [facility] OR;  Service: Orthopedics;  Laterality: Left;   IM NAILING HUMERUS     SHOULDER SURGERY         Family History  Problem Relation Age of Onset   Healthy Mother    COPD Father     Social History   Tobacco Use    Smoking status: Never   Smokeless tobacco: Current    Types: Chew  Vaping Use   Vaping Use: Never used  Substance Use Topics   Alcohol use: No   Drug use: Not Currently    Types: Marijuana    Comment: occais pain pill per pt    Home Medications Prior to Admission medications   Medication Sig Start Date End Date Taking? Authorizing Provider  cyclobenzaprine (FLEXERIL) 10 MG tablet Take 1 tablet (10 mg total) by mouth 3 (three) times daily as needed. Patient not taking: Reported on 03/28/2020 01/27/20   Aditi Rovira, PA-C  diclofenac Sodium (VOLTAREN) 1 % GEL Apply 2 g topically 4 (four) times daily as needed. 03/28/20   Long, Arlyss Repress, MD  doxycycline (VIBRAMYCIN) 100 MG capsule Take 1 capsule (100 mg total) by mouth 2 (two) times daily. 07/11/21   Bing Neighbors, FNP  gabapentin (NEURONTIN) 100 MG capsule Take 100 mg by mouth 3 (three) times daily. 01/08/20   [provider]  ibuprofen (ADVIL) 800 MG tablet Take 1 tablet (800 mg total) by mouth every 8 (eight) hours as needed for moderate pain. 03/28/20   Long, Arlyss Repress, MD  methocarbamol (ROBAXIN) 500 MG tablet Take 1 tablet (500 mg total) by mouth every 8 (eight) hours  as needed for muscle spasms. 03/28/20   Long, Arlyss Repress, MD  naproxen (NAPROSYN) 375 MG tablet Take 1 tablet (375 mg total) by mouth 2 (two) times daily. 07/11/21   Bing Neighbors, FNP    Allergies    Penicillins and Bee venom  Review of Systems   Review of Systems  Constitutional:  Negative for chills and fever.  Respiratory:  Negative for shortness of breath.   Cardiovascular:  Negative for chest pain.  Gastrointestinal:  Negative for abdominal pain, nausea and vomiting.  Genitourinary:  Negative for dysuria.  Musculoskeletal:  Positive for arthralgias (right wrist pain) and back pain. Negative for myalgias, neck pain and neck stiffness.  Skin:  Negative for color change, rash and wound.  Neurological:  Negative for dizziness, syncope, speech difficulty,  weakness, numbness and headaches.  Hematological:  Does not bruise/bleed easily.   Physical Exam Updated Vital Signs BP 109/89 (BP Location: Right Arm)   Pulse 94   Temp 97.9 F (36.6 C) (Oral)   Resp 16   SpO2 97%   Physical Exam Vitals and nursing note reviewed.  Constitutional:      General: He is not in acute distress.    Appearance: Normal appearance. He is not ill-appearing.  HENT:     Head: Atraumatic.  Cardiovascular:     Rate and Rhythm: Normal rate and regular rhythm.     Pulses: Normal pulses.  Pulmonary:     Effort: Pulmonary effort is normal. No respiratory distress.  Abdominal:     Palpations: Abdomen is soft.     Tenderness: There is no abdominal tenderness.  Musculoskeletal:        General: Tenderness and signs of injury present. Normal range of motion.     Right wrist: Tenderness present. No swelling, deformity, lacerations, bony tenderness, snuff box tenderness or crepitus. Normal range of motion. Normal pulse.     Right hand: No swelling or bony tenderness. Normal range of motion. Normal strength. Normal sensation. Normal capillary refill. Normal pulse.     Cervical back: Full passive range of motion without pain and normal range of motion.     Comments: Ttp of the lower lumbar spine and right paraspinal muscles.  Small abrasion noted.  No edema.  Tenderness of distal right wrist.  No edema or bony deformity.  Pt has FROM of the fingers of right hand.    Skin:    General: Skin is warm.     Capillary Refill: Capillary refill takes less than 2 seconds.  Neurological:     General: No focal deficit present.     Mental Status: He is alert.     Sensory: No sensory deficit.     Motor: No weakness.    ED Results / Procedures / Treatments   Labs (all labs ordered are listed, but only abnormal results are displayed) Labs Reviewed - No data to display  EKG None  Radiology DG Lumbar Spine Complete  Result Date: 08/06/2021 CLINICAL DATA:  Fall from window  EXAM: LUMBAR SPINE - COMPLETE 4+ VIEW COMPARISON:  02/27/2015 FINDINGS: There is no evidence of lumbar spine fracture. Alignment is normal. Intervertebral disc spaces are maintained. IMPRESSION: Negative. Electronically Signed   By: Marlan Palau M.D.   On: 08/06/2021 15:22   DG Wrist Complete Right  Result Date: 08/06/2021 CLINICAL DATA:  Complains of right wrist pain. Jumped out of window. EXAM: RIGHT WRIST - COMPLETE 3+ VIEW COMPARISON:  None. FINDINGS: There is no evidence of fracture or  dislocation. There is no evidence of arthropathy or other focal bone abnormality. Soft tissues are unremarkable. IMPRESSION: Negative. Electronically Signed   By: Signa Kell M.D.   On: 08/06/2021 13:22    Procedures Procedures   Medications Ordered in ED Medications  acetaminophen (TYLENOL) tablet 650 mg (650 mg Oral Given 08/06/21 1434)    ED Course  I have reviewed the triage vital signs and the nursing notes.  Pertinent labs & imaging results that were available during my care of the patient were reviewed by me and considered in my medical decision making (see chart for details).    MDM Rules/Calculators/A&P                           Pt with low back pain and wrist pain after injury.  XR's negative. Pt is ambulatory with steady gait.  No focal neuro deficits.  Likely lumbar contusion/sprain.  No red flags concerning for cauda equina.  Paraesthesias of the wrist likely secondary to strain.    Wrist brace given. Agrees to RICE therapy, close orthopedic f/u.  Return precautions discussed.    Final Clinical Impression(s) / ED Diagnoses Final diagnoses:  Contusion of lower back, initial encounter  Abrasion of lower back, initial encounter  Sprain of right wrist, initial encounter    Rx / DC Orders ED Discharge Orders     None        Pauline Aus, PA-C 08/07/21 1345    Mancel Bale, MD 08/09/21 2042

## 2024-02-09 ENCOUNTER — Encounter: Payer: Self-pay | Admitting: Family Medicine

## 2024-02-09 ENCOUNTER — Ambulatory Visit (INDEPENDENT_AMBULATORY_CARE_PROVIDER_SITE_OTHER): Payer: Self-pay | Admitting: Family Medicine

## 2024-02-09 VITALS — BP 125/87 | HR 68 | Ht 65.0 in | Wt 193.0 lb

## 2024-02-09 DIAGNOSIS — G43009 Migraine without aura, not intractable, without status migrainosus: Secondary | ICD-10-CM

## 2024-02-09 DIAGNOSIS — Z114 Encounter for screening for human immunodeficiency virus [HIV]: Secondary | ICD-10-CM

## 2024-02-09 DIAGNOSIS — R7301 Impaired fasting glucose: Secondary | ICD-10-CM | POA: Diagnosis not present

## 2024-02-09 DIAGNOSIS — E559 Vitamin D deficiency, unspecified: Secondary | ICD-10-CM

## 2024-02-09 DIAGNOSIS — I10 Essential (primary) hypertension: Secondary | ICD-10-CM

## 2024-02-09 DIAGNOSIS — G43909 Migraine, unspecified, not intractable, without status migrainosus: Secondary | ICD-10-CM | POA: Insufficient documentation

## 2024-02-09 DIAGNOSIS — Z1159 Encounter for screening for other viral diseases: Secondary | ICD-10-CM

## 2024-02-09 DIAGNOSIS — E038 Other specified hypothyroidism: Secondary | ICD-10-CM | POA: Diagnosis not present

## 2024-02-09 DIAGNOSIS — Z8669 Personal history of other diseases of the nervous system and sense organs: Secondary | ICD-10-CM

## 2024-02-09 MED ORDER — SUMATRIPTAN SUCCINATE 25 MG PO TABS: 25.0000 mg | ORAL_TABLET | Freq: Two times a day (BID) | ORAL | 0 refills | Status: DC | PRN

## 2024-02-09 MED ORDER — BENZONATATE 200 MG PO CAPS: 200.0000 mg | ORAL_CAPSULE | Freq: Two times a day (BID) | ORAL | 0 refills | Status: DC | PRN

## 2024-02-09 NOTE — Assessment & Plan Note (Signed)
 Currently not taking any medication  Referral placed to Neurology

## 2024-02-09 NOTE — Assessment & Plan Note (Signed)
Trial on Imitrex 25 mg PRN Advise methods include deep breathing, cognitive behavioral therapy focusing on changing thoughts, Limit or avoid alcohol, caffeine, chocolate, canner foods, MSG and aspartame.  Implement sleep hygiene includes not watching TV or listen music in bed, don't eat heavy meals within a couple of hours of bedtime, don't use your phone, laptop, or tablet at bedtime

## 2024-02-09 NOTE — Patient Instructions (Signed)

## 2024-02-09 NOTE — Progress Notes (Signed)
 New Patient Office Visit   Subjective   Patient ID: Edward Zamora, male    DOB: 1986/08/26  Age: 38 y.o. MRN: [id number]  CC:  Chief Complaint  Patient presents with   Establish Care    Requesting a referral for neurology was told by prison physician that he had a seizure that was spitting up blood and may possibly have brain cancer. He would like a second opinion. He reports having migraines since seizure w/ aura such as sensitivity to light,sound, and smells. He also states he is dizzy all the time and has chronic cough during sleep     HPI Edward Zamora  38 year old male, presents to establish care. He  has a past medical history of Depression, Hypertension, and Seizures (HCC).  Migraine The patient reports a recurrent history of migraines. The current episode has been ongoing for over one month and is described as constant, with a gradual worsening in intensity. The pain is localized to the frontal and temporal regions without radiation. The patient describes the pain as stabbing, throbbing, and aching, with a severity rated at 10/10. Associated symptoms include coughing, dizziness, loss of balance, and photophobia. The patient denies back pain, eye pain, fever, insomnia, nausea, scalp tenderness, or visual changes. Symptoms are exacerbated by physical activity, bright light, and emotional stress. The patient has tried aspirin, NSAIDS for relief, which was ineffective.      Outpatient Encounter Medications as of 02/09/2024  Medication Sig   acetaminophen  (TYLENOL ) 325 MG tablet Take 325 mg by mouth every 4 (four) hours as needed for mild pain (pain score 1-3).   benzonatate  (TESSALON ) 200 MG capsule Take 1 capsule (200 mg total) by mouth 2 (two) times daily as needed for cough.   SUMAtriptan  (IMITREX ) 25 MG tablet Take 1 tablet (25 mg total) by mouth every 12 (twelve) hours as needed for migraine. May repeat in 2 hours if headache persists or recurs.   cyclobenzaprine  (FLEXERIL ) 10  MG tablet Take 1 tablet (10 mg total) by mouth 3 (three) times daily as needed. (Patient not taking: Reported on 03/28/2020)   diclofenac  Sodium (VOLTAREN ) 1 % GEL Apply 2 g topically 4 (four) times daily as needed. (Patient not taking: Reported on 02/09/2024)   doxycycline  (VIBRAMYCIN ) 100 MG capsule Take 1 capsule (100 mg total) by mouth 2 (two) times daily. (Patient not taking: Reported on 02/09/2024)   gabapentin (NEURONTIN) 100 MG capsule Take 100 mg by mouth 3 (three) times daily. (Patient not taking: Reported on 02/09/2024)   ibuprofen  (ADVIL ) 800 MG tablet Take 1 tablet (800 mg total) by mouth every 8 (eight) hours as needed for moderate pain. (Patient not taking: Reported on 02/09/2024)   methocarbamol  (ROBAXIN ) 500 MG tablet Take 1 tablet (500 mg total) by mouth every 8 (eight) hours as needed for muscle spasms. (Patient not taking: Reported on 02/09/2024)   naproxen  (NAPROSYN ) 375 MG tablet Take 1 tablet (375 mg total) by mouth 2 (two) times daily. (Patient not taking: Reported on 02/09/2024)   No facility-administered encounter medications on file as of 02/09/2024.    Past Surgical History:  Procedure Laterality Date   AMPUTATION Left 06/26/2019   Procedure: left ring finger revision AMPUTATION;  Surgeon: Arvil Birks, MD;  Location: [facility] OR;  Service: Orthopedics;  Laterality: Left;   finger     FINGER SURGERY     I & D EXTREMITY Left 06/26/2019   Procedure: Irrigation And Debridement Left ring finger ;  Surgeon: Annamae Barrett,  Aron Lard, MD;  Location: [facility] OR;  Service: Orthopedics;  Laterality: Left;   IM NAILING HUMERUS     SHOULDER SURGERY      Review of Systems  Constitutional:  Negative for chills and fever.  Eyes:  Positive for photophobia. Negative for pain.  Respiratory:  Positive for cough. Negative for shortness of breath.   Cardiovascular:  Negative for chest pain.  Gastrointestinal:  Negative for nausea.  Genitourinary:  Negative for dysuria.  Musculoskeletal:  Negative for back pain  and myalgias.  Skin:  Negative for itching and rash.  Neurological:  Positive for dizziness and headaches.  Psychiatric/Behavioral:  The patient does not have insomnia.       Objective    BP 125/87   Pulse 68   Ht 5\' 5"  (1.651 m)   Wt 193 lb (87.5 kg)   SpO2 93%   BMI 32.12 kg/m   Physical Exam Vitals reviewed.  Constitutional:      General: He is not in acute distress.    Appearance: Normal appearance. He is not ill-appearing, toxic-appearing or diaphoretic.  HENT:     Head: Normocephalic.     Right Ear: Tympanic membrane normal.     Left Ear: Tympanic membrane normal.     Mouth/Throat:     Mouth: Mucous membranes are moist.  Eyes:     General:        Right eye: No discharge.        Left eye: No discharge.     Conjunctiva/sclera: Conjunctivae normal.     Pupils: Pupils are equal, round, and reactive to light.  Cardiovascular:     Rate and Rhythm: Normal rate.     Pulses: Normal pulses.     Heart sounds: Normal heart sounds.  Pulmonary:     Effort: Pulmonary effort is normal. No respiratory distress.     Breath sounds: Normal breath sounds.  Abdominal:     General: Bowel sounds are normal.     Palpations: Abdomen is soft.     Tenderness: There is no abdominal tenderness. There is no right CVA tenderness, left CVA tenderness or guarding.  Musculoskeletal:        General: Normal range of motion.     Cervical back: Normal range of motion.  Skin:    General: Skin is warm and dry.     Capillary Refill: Capillary refill takes less than 2 seconds.  Neurological:     Mental Status: He is alert.     Coordination: Coordination normal.     Gait: Gait normal.  Psychiatric:        Mood and Affect: Mood normal.        Behavior: Behavior normal.       Assessment & Plan:  Primary hypertension -     Lipid panel -     CMP14+EGFR -     CBC with Differential/Platelet  Need for hepatitis C screening test -     Hepatitis C antibody  Vitamin D  deficiency -     VITAMIN D   25 Hydroxy (Vit-D Deficiency, Fractures)  TSH (thyroid-stimulating hormone deficiency) -     TSH + free T4  IFG (impaired fasting glucose) -     Hemoglobin A1c  Encounter for screening for HIV -     HIV Antibody (routine testing w rflx)  Hx of seizure disorder Assessment & Plan: Currently not taking any medication  Referral placed to Neurology   Orders: -     Ambulatory referral to Neurology  Migraine without aura and without status migrainosus, not intractable Assessment & Plan: Trial on Imitrex  25 mg PRN   Advise methods include deep breathing, cognitive behavioral therapy focusing on changing thoughts, Limit or avoid alcohol, caffeine, chocolate, canner foods, MSG and aspartame.  Implement sleep hygiene includes not watching TV or listen music in bed, don't eat heavy meals within a couple of hours of bedtime, don't use your phone, laptop, or tablet at bedtime    Orders: -     SUMAtriptan  Succinate; Take 1 tablet (25 mg total) by mouth every 12 (twelve) hours as needed for migraine. May repeat in 2 hours if headache persists or recurs.  Dispense: 10 tablet; Refill: 0  Other orders -     Benzonatate ; Take 1 capsule (200 mg total) by mouth 2 (two) times daily as needed for cough.  Dispense: 20 capsule; Refill: 0    Return in 6 months (on 08/10/2024), or if symptoms worsen or fail to improve, for routine labs.   Avelino Lek Amber Bail, FNP

## 2024-02-10 LAB — CMP14+EGFR
ALT: 50 IU/L — ABNORMAL HIGH (ref 0–44)
AST: 27 IU/L (ref 0–40)
Albumin: 4.1 g/dL (ref 4.1–5.1)
Alkaline Phosphatase: 79 IU/L (ref 44–121)
BUN/Creatinine Ratio: 7 — ABNORMAL LOW (ref 9–20)
BUN: 8 mg/dL (ref 6–20)
Bilirubin Total: 0.3 mg/dL (ref 0.0–1.2)
CO2: 19 mmol/L — ABNORMAL LOW (ref 20–29)
Calcium: 8.7 mg/dL (ref 8.7–10.2)
Chloride: 105 mmol/L (ref 96–106)
Creatinine, Ser: 1.07 mg/dL (ref 0.76–1.27)
Globulin, Total: 2.3 g/dL (ref 1.5–4.5)
Glucose: 86 mg/dL (ref 70–99)
Potassium: 4 mmol/L (ref 3.5–5.2)
Sodium: 138 mmol/L (ref 134–144)
Total Protein: 6.4 g/dL (ref 6.0–8.5)
eGFR: 91 mL/min/{1.73_m2} (ref >59)

## 2024-02-10 LAB — CBC WITH DIFFERENTIAL/PLATELET
Basophils Absolute: 0.1 10*3/uL (ref 0.0–0.2)
Basos: 1 %
EOS (ABSOLUTE): 0.1 10*3/uL (ref 0.0–0.4)
Eos: 2 %
Hematocrit: 48.2 % (ref 37.5–51.0)
Hemoglobin: 16.3 g/dL (ref 13.0–17.7)
Immature Grans (Abs): 0 10*3/uL (ref 0.0–0.1)
Immature Granulocytes: 0 %
Lymphocytes Absolute: 3.1 10*3/uL (ref 0.7–3.1)
Lymphs: 45 %
MCH: 29.1 pg (ref 26.6–33.0)
MCHC: 33.8 g/dL (ref 31.5–35.7)
MCV: 86 fL (ref 79–97)
Monocytes Absolute: 0.5 10*3/uL (ref 0.1–0.9)
Monocytes: 8 %
Neutrophils Absolute: 3 10*3/uL (ref 1.4–7.0)
Neutrophils: 44 %
Platelets: 233 10*3/uL (ref 150–450)
RBC: 5.61 x10E6/uL (ref 4.14–5.80)
RDW: 13.2 % (ref 11.6–15.4)
WBC: 6.9 10*3/uL (ref 3.4–10.8)

## 2024-02-10 LAB — LIPID PANEL
Chol/HDL Ratio: 5.1 ratio — ABNORMAL HIGH (ref 0.0–5.0)
Cholesterol, Total: 179 mg/dL (ref 100–199)
HDL: 35 mg/dL — ABNORMAL LOW (ref >39)
LDL Chol Calc (NIH): 121 mg/dL — ABNORMAL HIGH (ref 0–99)
Triglycerides: 129 mg/dL (ref 0–149)
VLDL Cholesterol Cal: 23 mg/dL (ref 5–40)

## 2024-02-10 LAB — HIV ANTIBODY (ROUTINE TESTING W REFLEX): HIV Screen 4th Generation wRfx: NONREACTIVE

## 2024-02-10 LAB — HEPATITIS C ANTIBODY: Hep C Virus Ab: NONREACTIVE

## 2024-02-10 LAB — HEMOGLOBIN A1C
Est. average glucose Bld gHb Est-mCnc: 128 mg/dL
Hgb A1c MFr Bld: 6.1 % — ABNORMAL HIGH (ref 4.8–5.6)

## 2024-02-10 LAB — VITAMIN D 25 HYDROXY (VIT D DEFICIENCY, FRACTURES): Vit D, 25-Hydroxy: 16.4 ng/mL — ABNORMAL LOW (ref 30.0–100.0)

## 2024-02-10 LAB — TSH+FREE T4
Free T4: 1.09 ng/dL (ref 0.82–1.77)
TSH: 0.621 u[IU]/mL (ref 0.450–4.500)

## 2024-02-14 NOTE — Progress Notes (Signed)
 Please inform patient,   Cholesterol levels elevated, start lifestyle modifications and follow diet low in saturated fat.  Diet to Lower Cholesterol Eat More: Oats, beans, and lentils: High in soluble fiber. Fatty fish: Salmon, tuna (rich in omega-3s). Nuts and seeds: Almonds, walnuts, flaxseeds. Fruits and vegetables: Apples, berries, leafy greens. Healthy fats: Olive oil, avocado. Limit: Saturated fats: Butter, cream, fatty meats. Trans fats: Fried foods, processed snacks. Sugar and refined carbs: Sweets, white bread. Focus on whole foods, healthy fats, and fiber to improve heart health! Maintain an exercise routine 3 to 5 days a week for a minimum total of 150 minutes.     Hemoglobin A1c  6.1 indicates prediabetes - no medication intervention just lifestyle changes   Pre-Diabetes Diet  Eat More:  Whole grains: Oats, quinoa, brown rice. Vegetables: Leafy greens, broccoli, green beans. Fruits: Low-sugar like berries, apples. Lean proteins: Chicken, fish, beans, eggs. Healthy fats: Nuts, seeds, avocado, olive oil.  Limit:  Refined carbs: White bread, pastries. Sugary foods: Soda, candy, desserts. Fried and fatty foods.  Tips:  Eat balanced meals with portion control. Stay hydrated (water over sugary drinks). Pair with regular exercise (e.g., walking). Example: Grilled chicken, quinoa, and steamed broccoli.  Combine diet with regular physical activity (e.g., 30 minutes of walking daily or 5 times per week.)     Vitamin D  levels low, I advise to taking  over the counter supplements of vitamin D  1000 IU/day to prevent low vitamin D  levels.  Consume Vitamin D -Rich Foods: Fatty Fish: Salmon, mackerel, tuna, and sardines. Egg Yolks: A good source if eaten whole. Fortified Foods: Milk, orange juice, cereals, and plant-based milks (like almond or soy milk). Mushrooms: Especially those exposed to sunlight or UV light. Cod Liver Oil: A concentrated source of vitamin  D. Including these foods in your diet can help boost vitamin D  levels   Symptoms of Low Vitamin D :  Fatigue and low energy. Bone pain and muscle weakness. Increased risk of fractures or weak bones. Frequent illness or infections. Mood changes, like depression or anxiety. Hair loss or thinning.

## 2024-03-28 ENCOUNTER — Ambulatory Visit: Admitting: Neurology

## 2024-03-28 ENCOUNTER — Encounter: Payer: Self-pay | Admitting: Neurology

## 2024-03-28 VITALS — BP 119/81 | HR 97 | Ht 63.0 in | Wt 200.4 lb

## 2024-03-28 DIAGNOSIS — G43009 Migraine without aura, not intractable, without status migrainosus: Secondary | ICD-10-CM

## 2024-03-28 DIAGNOSIS — R569 Unspecified convulsions: Secondary | ICD-10-CM | POA: Diagnosis not present

## 2024-03-28 MED ORDER — TOPIRAMATE 25 MG PO TABS: 25.0000 mg | ORAL_TABLET | Freq: Every evening | ORAL | 3 refills | Status: AC

## 2024-03-28 MED ORDER — SUMATRIPTAN SUCCINATE 50 MG PO TABS: 50.0000 mg | ORAL_TABLET | Freq: Two times a day (BID) | ORAL | 6 refills | Status: AC | PRN

## 2024-03-28 NOTE — Progress Notes (Signed)
 [facility]  PATIENT: Edward Zamora DOB: 1986-09-01  REQUESTING CLINICIAN: Del Orbe Polanco, Ilian* HISTORY FROM: Patient/Mother/Chart review REASON FOR VISIT: Seizure/headaches    HISTORICAL  CHIEF COMPLAINT:  Chief Complaint  Patient presents with   New Patient (Initial Visit)    Rm17, mother present, NP internal referral for hx of seizures:last sz occurred in the fall, not taking sz med.  and migraines: daily w/varying intensity     HISTORY OF PRESENT ILLNESS:  This is a 38 year old gentleman past medical history of migraine headaches, hypertension, depression who is presenting for evaluation for a single seizure he sustained while he was incarcerated.  Patient tells me that he was incarcerated last year from April to October 14 2023.  In August he presented to [facility] following a seizure.  Per chart review, patient use marijuana, and had seizure like activity with tongue biting.  He did have a full workup including MRI, EEG and they were all normal.  He was initially started on Keppra 500 mg twice daily, in September he follow-up with a neurologist, who determined this was a provoked seizure and discontinue the Keppra since all testing including MRI and EEG were normal.  Since then he has not had any additional seizure, he never experienced seizures in the past, but tells me that he has a paternal cousin with seizures. His complaint today is headache, diagnosed with migraine, tells me that currently he is having headaches every other day.  He is on Imitrex  as needed, not on any preventive medication.   Handedness: Right handed   Onset:  September 2024  Seizure Type: Possible convulsion   Current frequency: Only once  Any injuries from seizures: Generalized convulsion   Seizure risk factors: Paternal cousin, history of head trauma  Previous ASMs: Levetiracetam   Currenty ASMs: None   ASMs side effects: N/A   Brain Images: No acute abnormalities    Previous EEGs: Normal    OTHER MEDICAL CONDITIONS: Headaches, Depression, Hypertension   REVIEW OF SYSTEMS: Full 14 system review of systems performed and negative with exception of: As noted in the HPI   ALLERGIES: Allergies  Allergen Reactions   Bee Venom Swelling    Eye Area  Eye Area     Eye Area   Penicillins Anaphylaxis   Bee Pollen Hives    HOME MEDICATIONS: Outpatient Medications Prior to Visit  Medication Sig Dispense Refill   acetaminophen  (TYLENOL ) 325 MG tablet Take 325 mg by mouth every 4 (four) hours as needed for mild pain (pain score 1-3).     benzonatate  (TESSALON ) 200 MG capsule Take 1 capsule (200 mg total) by mouth 2 (two) times daily as needed for cough. 20 capsule 0   cyclobenzaprine  (FLEXERIL ) 10 MG tablet Take 1 tablet (10 mg total) by mouth 3 (three) times daily as needed. (Patient not taking: Reported on 03/28/2020) 21 tablet 0   diclofenac  Sodium (VOLTAREN ) 1 % GEL Apply 2 g topically 4 (four) times daily as needed. (Patient not taking: Reported on 02/09/2024) 50 g 0   doxycycline  (VIBRAMYCIN ) 100 MG capsule Take 1 capsule (100 mg total) by mouth 2 (two) times daily. (Patient not taking: Reported on 02/09/2024) 20 capsule 0   gabapentin (NEURONTIN) 100 MG capsule Take 100 mg by mouth 3 (three) times daily. (Patient not taking: Reported on 02/09/2024)     ibuprofen  (ADVIL ) 800 MG tablet Take 1 tablet (800 mg total) by mouth every 8 (eight) hours as needed for moderate pain. (Patient not  taking: Reported on 02/09/2024) 21 tablet 0   methocarbamol  (ROBAXIN ) 500 MG tablet Take 1 tablet (500 mg total) by mouth every 8 (eight) hours as needed for muscle spasms. (Patient not taking: Reported on 02/09/2024) 20 tablet 0   naproxen  (NAPROSYN ) 375 MG tablet Take 1 tablet (375 mg total) by mouth 2 (two) times daily. (Patient not taking: Reported on 02/09/2024) 20 tablet 0   SUMAtriptan  (IMITREX ) 25 MG tablet Take 1 tablet (25 mg total) by mouth every 12 (twelve) hours as  needed for migraine. May repeat in 2 hours if headache persists or recurs. 10 tablet 0   No facility-administered medications prior to visit.    PAST MEDICAL HISTORY: Past Medical History:  Diagnosis Date   Depression    Hypertension    Seizures (HCC)     PAST SURGICAL HISTORY: Past Surgical History:  Procedure Laterality Date   AMPUTATION Left 06/26/2019   Procedure: left ring finger revision AMPUTATION;  Surgeon: Arvil Birks, MD;  Location: [facility] OR;  Service: Orthopedics;  Laterality: Left;   finger     FINGER SURGERY     I & D EXTREMITY Left 06/26/2019   Procedure: Irrigation And Debridement Left ring finger ;  Surgeon: Arvil Birks, MD;  Location: [facility] OR;  Service: Orthopedics;  Laterality: Left;   IM NAILING HUMERUS     SHOULDER SURGERY      FAMILY HISTORY: Family History  Problem Relation Age of Onset   Healthy Mother    COPD Father     SOCIAL HISTORY: Social History   Socioeconomic History   Marital status: Single    Spouse name: Not on file   Number of children: 1   Years of education: Not on file   Highest education level: High school graduate  Occupational History   Not on file  Tobacco Use   Smoking status: Former    Types: Cigarettes   Smokeless tobacco: Current    Types: Chew  Vaping Use   Vaping status: Never Used  Substance and Sexual Activity   Alcohol use: Yes    Comment: occ   Drug use: Not Currently    Types: Marijuana    Comment: occais pain pill per pt   Sexual activity: Never    Partners: Female  Other Topics Concern   Not on file  Social History Narrative   Not on file   Social Drivers of Health   Financial Resource Strain: Low Risk  (06/19/2023)   Received from [facility]   Overall Financial Resource Strain (CARDIA)    Difficulty of Paying Living Expenses: Not very hard  Food Insecurity: No Food Insecurity (06/19/2023)   [facility]   Hunger Vital Sign    Worried About Running Out of Food in the Last  Year: Never true    Ran Out of Food in the Last Year: Never true  Transportation Needs: No Transportation Needs (06/19/2023)   Received from [facility]   PRAPARE - Transportation    Lack of Transportation (Medical): No    Lack of Transportation (Non-Medical): No  Physical Activity: Not on file  Stress: Not on file  Social Connections: Unknown (03/01/2022)   Received from [facility]   Social Network    Social Network: Not on file  Intimate Partner Violence: Unknown (01/28/2022)   Received from [facility]   HITS    Physically Hurt: Not on file    Insult or Talk Down To: Not on file  Threaten Physical Harm: Not on file    Scream or Curse: Not on file    PHYSICAL EXAM  GENERAL EXAM/CONSTITUTIONAL: Vitals:  Vitals:   03/28/24 1520  BP: 119/81  Pulse: 97  Weight: 200 lb 6.4 oz (90.9 kg)  Height: 5\' 3"  (1.6 m)   Body mass index is 35.5 kg/m. Wt Readings from Last 3 Encounters:  03/28/24 200 lb 6.4 oz (90.9 kg)  02/09/24 193 lb (87.5 kg)  06/06/20 160 lb (72.6 kg)   Patient is in no distress; well developed, nourished and groomed; neck is supple  MUSCULOSKELETAL: Gait, strength, tone, movements noted in Neurologic exam below  NEUROLOGIC: MENTAL STATUS:      No data to display         awake, alert, oriented to person, place and time recent and remote memory intact normal attention and concentration language fluent, comprehension intact, naming intact fund of knowledge appropriate  CRANIAL NERVE:  2nd, 3rd, 4th, 6th - Visual fields full to confrontation, extraocular muscles intact, no nystagmus 5th - facial sensation symmetric 7th - facial strength symmetric 8th - hearing intact 9th - palate elevates symmetrically, uvula midline 11th - shoulder shrug symmetric 12th - tongue protrusion midline  MOTOR:  normal bulk and tone, full strength in the BUE, BLE  SENSORY:  normal and symmetric to light touch  COORDINATION:  finger-nose-finger, fine  finger movements normal  GAIT/STATION:  normal   DIAGNOSTIC DATA (LABS, IMAGING, TESTING) - I reviewed patient records, labs, notes, testing and imaging myself where available.  Lab Results  Component Value Date   WBC 6.9 02/09/2024   HGB 16.3 02/09/2024   HCT 48.2 02/09/2024   MCV 86 02/09/2024   PLT 233 02/09/2024      Component Value Date/Time   NA 138 02/09/2024 1417   K 4.0 02/09/2024 1417   CL 105 02/09/2024 1417   CO2 19 (L) 02/09/2024 1417   GLUCOSE 86 02/09/2024 1417   GLUCOSE 82 08/22/2020 1554   BUN 8 02/09/2024 1417   CREATININE 1.07 02/09/2024 1417   CALCIUM 8.7 02/09/2024 1417   PROT 6.4 02/09/2024 1417   ALBUMIN 4.1 02/09/2024 1417   AST 27 02/09/2024 1417   ALT 50 (H) 02/09/2024 1417   ALKPHOS 79 02/09/2024 1417   BILITOT 0.3 02/09/2024 1417   GFRNONAA >60 08/22/2020 1554   GFRAA >60 03/28/2020 1316   Lab Results  Component Value Date   CHOL 179 02/09/2024   HDL 35 (L) 02/09/2024   LDLCALC 121 (H) 02/09/2024   TRIG 129 02/09/2024   Lab Results  Component Value Date   HGBA1C 6.1 (H) 02/09/2024   No results found for: "VITAMINB12" Lab Results  Component Value Date   TSH 0.621 02/09/2024    MRI Brain 06/19/2023 1.  No significant intracranial abnormality is identified.   rEEG 07/17/2023 This routine EEG is within normal limits. A normal EEG does not exclude the possibility of seizures.    ASSESSMENT AND PLAN  38 y.o. year old male  with migraine, hypertension, depression who is presenting after a single seizure and management of his migraine.  For his single seizure, this was most likely provoked with the use of marijuana.  His workup including MRI EEG were negative, Keppra discontinued and he has been doing well no additional seizures.  No further neurological workup indicated for that single seizure For his migraines, he is currently on Imitrex  25 mg, no preventive medication.  Plan will be to start patient on preventative medication;  topiramate nightly and to increase the Imitrex  to 50 mg.  Advised him to continue following up with his PCP for medication refill and to return as needed or for any other conditions.   1. Provoked seizure (HCC)   2. Migraine without aura and without status migrainosus, not intractable     Patient Instructions  Start topiramate 25 mg nightly, half tablet nightly for 1 week then increase to full tablet Imitrex  50 mg as needed for headaches Continue your other medications Follow-up PCP and return as needed.   Per [place] statutes, patients with seizures are not allowed to drive until they have been seizure-free for six months.  Other recommendations include using caution when using heavy equipment or power tools. Avoid working on ladders or at heights. Take showers instead of baths.  Do not swim alone.  Ensure the water temperature is not too high on the home water heater. Do not go swimming alone. Do not lock yourself in a room alone (i.e. bathroom). When caring for infants or small children, sit down when holding, feeding, or changing them to minimize risk of injury to the child in the event you have a seizure. Maintain good sleep hygiene. Avoid alcohol.  Also recommend adequate sleep, hydration, good diet and minimize stress.   During the Seizure  - First, ensure adequate ventilation and place patients on the floor on their left side  Loosen clothing around the neck and ensure the airway is patent. If the patient is clenching the teeth, do not force the mouth open with any object as this can cause severe damage - Remove all items from the surrounding that can be hazardous. The patient may be oblivious to what's happening and may not even know what he or she is doing. If the patient is confused and wandering, either gently guide him/her away and block access to outside areas - Reassure the individual and be comforting - Call 911. In most cases, the seizure ends before EMS arrives.  However, there are cases when seizures may last over 3 to 5 minutes. Or the individual may have developed breathing difficulties or severe injuries. If a pregnant patient or a person with diabetes develops a seizure, it is prudent to call an ambulance. - Finally, if the patient does not regain full consciousness, then call EMS. Most patients will remain confused for about 45 to 90 minutes after a seizure, so you must use judgment in calling for help. - Avoid restraints but make sure the patient is in a bed with padded side rails - Place the individual in a lateral position with the neck slightly flexed; this will help the saliva drain from the mouth and prevent the tongue from falling backward - Remove all nearby furniture and other hazards from the area - Provide verbal assurance as the individual is regaining consciousness - Provide the patient with privacy if possible - Call for help and start treatment as ordered by the caregiver   After the Seizure (Postictal Stage)  After a seizure, most patients experience confusion, fatigue, muscle pain and/or a headache. Thus, one should permit the individual to sleep. For the next few days, reassurance is essential. Being calm and helping reorient the person is also of importance.  Most seizures are painless and end spontaneously. Seizures are not harmful to others but can lead to complications such as stress on the lungs, brain and the heart. Individuals with prior lung problems may develop labored breathing and respiratory distress.  Discussed Patients with epilepsy have a small risk of sudden unexpected death, a condition referred to as sudden unexpected death in epilepsy (SUDEP). SUDEP is defined specifically as the sudden, unexpected, witnessed or unwitnessed, nontraumatic and nondrowning death in patients with epilepsy with or without evidence for a seizure, and excluding documented status epilepticus, in which post mortem examination does not reveal  a structural or toxicologic cause for death     No orders of the defined types were placed in this encounter.   Meds ordered this encounter  Medications   topiramate (TOPAMAX) 25 MG tablet    Sig: Take 1 tablet (25 mg total) by mouth at bedtime.    Dispense:  90 tablet    Refill:  3   SUMAtriptan  (IMITREX ) 50 MG tablet    Sig: Take 1 tablet (50 mg total) by mouth every 12 (twelve) hours as needed for migraine. May repeat in 2 hours if headache persists or recurs.    Dispense:  9 tablet    Refill:  6    Return if symptoms worsen or fail to improve.    Cassandra Cleveland, MD 03/28/2024, 5:18 PM  [facility] [address] [place], [place] [address] [phone]

## 2024-03-28 NOTE — Patient Instructions (Signed)
 Start topiramate 25 mg nightly, half tablet nightly for 1 week then increase to full tablet Imitrex  50 mg as needed for headaches Continue your other medications Follow-up PCP and return as needed.

## 2024-06-18 ENCOUNTER — Ambulatory Visit

## 2024-07-22 ENCOUNTER — Ambulatory Visit: Admitting: Family Medicine

## 2024-08-09 ENCOUNTER — Ambulatory Visit: Admitting: Family Medicine
